# Patient Record
Sex: Female | Born: 1985 | Race: White | Hispanic: No | Marital: Single | Smoking: Current every day smoker
Health system: Southern US, Community
[De-identification: ages and names within clinical notes are randomized; demographics above are authoritative.]

## PROBLEM LIST (undated history)

## (undated) DIAGNOSIS — R87629 Unspecified abnormal cytological findings in specimens from vagina: Secondary | ICD-10-CM

## (undated) DIAGNOSIS — G932 Benign intracranial hypertension: Secondary | ICD-10-CM

## (undated) DIAGNOSIS — L0232 Furuncle of buttock: Secondary | ICD-10-CM

## (undated) DIAGNOSIS — Z8742 Personal history of other diseases of the female genital tract: Secondary | ICD-10-CM

## (undated) HISTORY — DX: Furuncle of buttock: L02.32

## (undated) HISTORY — PX: OTHER SURGICAL HISTORY: SHX169

## (undated) HISTORY — PX: COLPOSCOPY: SHX161

## (undated) HISTORY — PX: MOUTH SURGERY: SHX715

## (undated) HISTORY — DX: Benign intracranial hypertension: G93.2

## (undated) HISTORY — DX: Personal history of other diseases of the female genital tract: Z87.42

## (undated) HISTORY — DX: Unspecified abnormal cytological findings in specimens from vagina: R87.629

---
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
## 2008-08-18 ENCOUNTER — Other Ambulatory Visit: Admission: RE | Admit: 2008-08-18 | Discharge: 2008-08-18 | Payer: Self-pay | Admitting: Obstetrics and Gynecology

## 2009-08-31 ENCOUNTER — Other Ambulatory Visit: Admission: RE | Admit: 2009-08-31 | Discharge: 2009-08-31 | Payer: Self-pay | Admitting: Obstetrics and Gynecology

## 2009-11-23 ENCOUNTER — Ambulatory Visit (HOSPITAL_COMMUNITY)
Admission: RE | Admit: 2009-11-23 | Discharge: 2009-11-23 | Payer: Self-pay | Source: Home / Self Care | Admitting: Obstetrics & Gynecology

## 2010-03-17 ENCOUNTER — Other Ambulatory Visit
Admission: RE | Admit: 2010-03-17 | Discharge: 2010-03-17 | Payer: Self-pay | Source: Home / Self Care | Admitting: Obstetrics & Gynecology

## 2010-06-23 LAB — CBC
Hemoglobin: 14.2 g/dL (ref 12.0–15.0)
Platelets: 184 10*3/uL (ref 150–400)
RBC: 4.43 MIL/uL (ref 3.87–5.11)
WBC: 6.7 10*3/uL (ref 4.0–10.5)

## 2010-06-23 LAB — SURGICAL PCR SCREEN

## 2010-06-23 LAB — URINALYSIS, ROUTINE W REFLEX MICROSCOPIC
Bilirubin Urine: NEGATIVE
Glucose, UA: NEGATIVE mg/dL
Nitrite: NEGATIVE
Specific Gravity, Urine: 1.025 (ref 1.005–1.030)
pH: 5.5 (ref 5.0–8.0)

## 2010-06-23 LAB — COMPREHENSIVE METABOLIC PANEL
ALT: 11 U/L (ref 0–35)
AST: 22 U/L (ref 0–37)
Albumin: 4.1 g/dL (ref 3.5–5.2)
Alkaline Phosphatase: 47 U/L (ref 39–117)
CO2: 26 mEq/L (ref 19–32)
Chloride: 106 mEq/L (ref 96–112)
Creatinine, Ser: 0.72 mg/dL (ref 0.4–1.2)
GFR calc Af Amer: >60 mL/min (ref >60)
GFR calc non Af Amer: >60 mL/min (ref >60)
Potassium: 3.5 mEq/L (ref 3.5–5.1)
Sodium: 137 mEq/L (ref 135–145)
Total Bilirubin: 0.5 mg/dL (ref 0.3–1.2)

## 2010-06-23 LAB — URINE MICROSCOPIC-ADD ON

## 2010-11-03 ENCOUNTER — Other Ambulatory Visit (HOSPITAL_COMMUNITY)
Admission: RE | Admit: 2010-11-03 | Discharge: 2010-11-03 | Disposition: A | Discharge status: Home / Self Care | Payer: Self-pay | Source: Ambulatory Visit | Attending: Obstetrics & Gynecology | Admitting: Obstetrics & Gynecology

## 2010-11-03 ENCOUNTER — Other Ambulatory Visit: Payer: Self-pay | Admitting: Obstetrics & Gynecology

## 2010-11-03 DIAGNOSIS — Z01419 Encounter for gynecological examination (general) (routine) without abnormal findings: Secondary | ICD-10-CM | POA: Insufficient documentation

## 2013-01-29 ENCOUNTER — Telehealth: Payer: Self-pay | Admitting: Adult Health

## 2013-01-29 MED ORDER — NORETHIN-ETH ESTRAD-FE BIPHAS 1 MG-10 MCG / 10 MCG PO TABS: 1.0000 | ORAL_TABLET | Freq: Every day | ORAL | Status: DC

## 2013-01-29 NOTE — Telephone Encounter (Signed)
Pt Mother, Synetta Fail, informed 1 box of lo loestrin samples left at front desk. Pt needs appt for annual exam.

## 2013-02-26 ENCOUNTER — Telehealth: Payer: Self-pay

## 2013-02-26 NOTE — Telephone Encounter (Signed)
Left message letting pt know 2 sample boxes of Lo Loestrin left at front desk. JSY

## 2013-03-26 ENCOUNTER — Telehealth: Payer: Self-pay | Admitting: *Deleted

## 2013-03-26 MED ORDER — NORETHIN-ETH ESTRAD-FE BIPHAS 1 MG-10 MCG / 10 MCG PO TABS: 1.0000 | ORAL_TABLET | Freq: Every day | ORAL | Status: DC

## 2013-03-26 NOTE — Telephone Encounter (Signed)
Pt informed lo loestrin sample x 1 box left at front desk and pt due for annual. Pt stated would make her appt for annual when she comes in to pick up samples.

## 2013-04-23 ENCOUNTER — Telehealth: Payer: Self-pay

## 2013-04-23 NOTE — Telephone Encounter (Signed)
LM samples left at front desk.

## 2013-05-19 ENCOUNTER — Ambulatory Visit (INDEPENDENT_AMBULATORY_CARE_PROVIDER_SITE_OTHER): Payer: Self-pay | Admitting: Adult Health

## 2013-05-19 ENCOUNTER — Encounter (INDEPENDENT_AMBULATORY_CARE_PROVIDER_SITE_OTHER): Payer: Self-pay

## 2013-05-19 ENCOUNTER — Other Ambulatory Visit (HOSPITAL_COMMUNITY)
Admission: RE | Admit: 2013-05-19 | Discharge: 2013-05-19 | Disposition: A | Discharge status: Home / Self Care | Payer: Self-pay | Source: Ambulatory Visit | Attending: Obstetrics and Gynecology | Admitting: Obstetrics and Gynecology

## 2013-05-19 ENCOUNTER — Encounter: Payer: Self-pay | Admitting: Adult Health

## 2013-05-19 VITALS — BP 120/80 | HR 76 | Ht 63.0 in | Wt 157.0 lb

## 2013-05-19 DIAGNOSIS — Z01419 Encounter for gynecological examination (general) (routine) without abnormal findings: Secondary | ICD-10-CM

## 2013-05-19 DIAGNOSIS — Z8742 Personal history of other diseases of the female genital tract: Secondary | ICD-10-CM | POA: Insufficient documentation

## 2013-05-19 DIAGNOSIS — Z309 Encounter for contraceptive management, unspecified: Secondary | ICD-10-CM | POA: Insufficient documentation

## 2013-05-19 HISTORY — DX: Personal history of other diseases of the female genital tract: Z87.42

## 2013-05-19 MED ORDER — NORETHIN-ETH ESTRAD-FE BIPHAS 1 MG-10 MCG / 10 MCG PO TABS: 1.0000 | ORAL_TABLET | Freq: Every day | ORAL | Status: DC

## 2013-05-19 NOTE — Patient Instructions (Signed)
Oral Contraception Use Oral contraceptive pills (OCPs) are medicines taken to prevent pregnancy. OCPs work by preventing the ovaries from releasing eggs. The hormones in OCPs also cause the cervical mucus to thicken, preventing the sperm from entering the uterus. The hormones also cause the uterine lining to become thin, not allowing a fertilized egg to attach to the inside of the uterus. OCPs are highly effective when taken exactly as prescribed. However, OCPs do not prevent sexually transmitted diseases (STDs). Safe sex practices, such as using condoms along with an OCP, can help prevent STDs. Before taking OCPs, you may have a physical exam and Pap test. Your health care provider may also order blood tests if necessary. Your health care provider will make sure you are a good candidate for oral contraception. Discuss with your health care provider the possible side effects of the OCP you may be prescribed. When starting an OCP, it can take 2 to 3 months for the body to adjust to the changes in hormone levels in your body.  HOW TO TAKE ORAL CONTRACEPTIVE PILLS Your health care provider may advise you on how to start taking the first cycle of OCPs. Otherwise, you can:   Start on day 1 of your menstrual period. You will not need any backup contraceptive protection with this start time.   Start on the first Sunday after your menstrual period or the day you get your prescription. In these cases, you will need to use backup contraceptive protection for the first week.   Start the pill at any time of your cycle. If you take the pill within 5 days of the start of your period, you are protected against pregnancy right away. In this case, you will not need a backup form of birth control. If you start at any other time of your menstrual cycle, you will need to use another form of birth control for 7 days. If your OCP is the type called a minipill, it will protect you from pregnancy after taking it for 2 days (48  hours). After you have started taking OCPs:   If you forget to take 1 pill, take it as soon as you remember. Take the next pill at the regular time.   If you miss 2 or more pills, call your health care provider because different pills have different instructions for missed doses. Use backup birth control until your next menstrual period starts.   If you use a 28-day pack that contains inactive pills and you miss 1 of the last 7 pills (pills with no hormones), it will not matter. Throw away the rest of the nonhormone pills and start a new pill pack.  No matter which day you start the OCP, you will always start a new pack on that same day of the week. Have an extra pack of OCPs and a backup contraceptive method available in case you miss some pills or lose your OCP pack.  HOME CARE INSTRUCTIONS   Do not smoke.   Always use a condom to protect against STDs. OCPs do not protect against STDs.   Use a calendar to mark your menstrual period days.   Read the information and directions that came with your OCP. Talk to your health care provider if you have questions.  SEEK MEDICAL CARE IF:   You develop nausea and vomiting.   You have abnormal vaginal discharge or bleeding.   You develop a rash.   You miss your menstrual period.   You are losing   your hair.   You need treatment for mood swings or depression.   You get dizzy when taking the OCP.   You develop acne from taking the OCP.   You become pregnant.  SEEK IMMEDIATE MEDICAL CARE IF:   You develop chest pain.   You develop shortness of breath.   You have an uncontrolled or severe headache.   You develop numbness or slurred speech.   You develop visual problems.   You develop pain, redness, and swelling in the legs.  Document Released: 03/15/2011 Document Revised: 11/26/2012 Document Reviewed: 09/14/2012 [facility] Patient Information 2014 [employer] [place]. Physical in 1 year Go to DSS to apply for  family planning medicaid

## 2013-05-19 NOTE — Progress Notes (Signed)
Patient ID: Alexis HackJessica P Gomez, female   DOB: 10/18/1985, 28 y.o.   MRN: [id number] History of Present Illness: Alexis Gomez is a 28 year old white female, single,in for pap and physical. Happy with her pills, no periods.Working 2 jobs, [employer], [employer].  Current Medications, Allergies, Past Medical History, Past Surgical History, Family History and Social History were reviewed in [facility] electronic [facility] record.     Review of Systems: Patient denies any headaches, blurred vision, shortness of breath, chest pain, abdominal pain, problems with bowel movements, urination, or intercourse, does have some discomfort at entrance with sex. No joint pain or mood swings.    Physical Exam:BP 120/80  Pulse 76  Ht 5\' 3"  (1.6 m)  Wt 157 lb (71.215 kg)  BMI 27.82 kg/m2 General:  Well developed, well nourished, no acute distress Skin:  Warm and dry Neck:  Midline trachea, normal thyroid Lungs; Clear to auscultation bilaterally Breast:  No dominant palpable mass, retraction, or nipple discharge Cardiovascular: Regular rate and rhythm Abdomen:  Soft, non tender, no hepatosplenomegaly Pelvic:  External genitalia is normal in appearance.  The vagina is normal in appearance. The cervix is irregular at os sp laser cone,pap performed.  Uterus is felt to be normal size, shape, and contour.  No    adnexal masses or tenderness noted. Extremities:  No swelling or varicosities noted Psych:  No mood changes, alert and cooperative,seems happy Discussed increased foreplay to ease discomfort at entrance to vagina with sex.  Impression: Yearly gyn exam  Contraceptive management History of abnormal pap    Plan: Physical in 1 year Continue lo loestrin  Number of samples 3 Lot number [id number]   Exp date 2/16 Apply for family planning medicaid  Review handout on OC use

## 2013-10-12 ENCOUNTER — Other Ambulatory Visit: Payer: Self-pay | Admitting: *Deleted

## 2013-10-12 MED ORDER — NORETHIN-ETH ESTRAD-FE BIPHAS 1 MG-10 MCG / 10 MCG PO TABS: 1.0000 | ORAL_TABLET | Freq: Every day | ORAL | Status: DC

## 2014-01-04 ENCOUNTER — Other Ambulatory Visit: Payer: Self-pay | Admitting: *Deleted

## 2014-01-04 MED ORDER — NORETHIN-ETH ESTRAD-FE BIPHAS 1 MG-10 MCG / 10 MCG PO TABS: 1.0000 | ORAL_TABLET | Freq: Every day | ORAL | Status: DC

## 2014-03-02 ENCOUNTER — Telehealth: Payer: Self-pay | Admitting: Adult Health

## 2014-03-02 NOTE — Telephone Encounter (Signed)
Pt's mom aware to come pick up Lo Loestrin samples, 2 boxes, Lot # [id number], exp. May 2016. JSY

## 2014-07-19 ENCOUNTER — Telehealth: Payer: Self-pay | Admitting: Adult Health

## 2014-07-19 MED ORDER — NORETHIN-ETH ESTRAD-FE BIPHAS 1 MG-10 MCG / 10 MCG PO TABS: 1.0000 | ORAL_TABLET | Freq: Every day | ORAL | Status: DC

## 2014-07-19 NOTE — Telephone Encounter (Signed)
Spoke with pt letting her know I had 3 sample boxes of LoLoestrin that she could pick up per JAG. Lot # [id number] A exp 9/16. JSY

## 2014-07-19 NOTE — Telephone Encounter (Signed)
3 packs lo loestrin given

## 2014-10-08 ENCOUNTER — Telehealth: Payer: Self-pay | Admitting: *Deleted

## 2014-10-08 NOTE — Telephone Encounter (Signed)
Spoke with pt's mom letting her know I had a sample of Lo Loestrin that she can pick up at the front desk. Lot # [id number] A exp May 2017. JSY

## 2014-11-03 ENCOUNTER — Telehealth: Payer: Self-pay | Admitting: Adult Health

## 2014-11-03 MED ORDER — NORETHIN-ETH ESTRAD-FE BIPHAS 1 MG-10 MCG / 10 MCG PO TABS: 1.0000 | ORAL_TABLET | Freq: Every day | ORAL | Status: DC

## 2014-11-03 NOTE — Telephone Encounter (Signed)
1 box of samples of Lo Loestrin Fe left at front desk for pt to pick up.

## 2014-12-06 ENCOUNTER — Telehealth: Payer: Self-pay | Admitting: *Deleted

## 2014-12-06 NOTE — Telephone Encounter (Signed)
Spoke with pt's mom. 2 sample boxes of Lo Loestrin given per JAG. Lot # [id number] A exp 6/17. JSY

## 2015-03-28 ENCOUNTER — Telehealth: Payer: Self-pay | Admitting: Adult Health

## 2015-03-28 MED ORDER — NORETHIN-ETH ESTRAD-FE BIPHAS 1 MG-10 MCG / 10 MCG PO TABS: 1.0000 | ORAL_TABLET | Freq: Every day | ORAL | Status: DC

## 2015-03-28 NOTE — Telephone Encounter (Signed)
Pt called stating that she would like her daughter to get the samples of her lo lo birth control. Please contact pt

## 2015-03-28 NOTE — Telephone Encounter (Signed)
Lo loestrin 1 box samples left at front desk, pt to schedule Physical with Cyril MourningJennifer Griffin, NP. Lot # [id number], Exp. 10/2015.

## 2015-04-28 ENCOUNTER — Other Ambulatory Visit (HOSPITAL_COMMUNITY)
Admission: RE | Admit: 2015-04-28 | Discharge: 2015-04-28 | Disposition: A | Discharge status: Home / Self Care | Payer: Self-pay | Source: Ambulatory Visit | Attending: Adult Health | Admitting: Adult Health

## 2015-04-28 ENCOUNTER — Ambulatory Visit (INDEPENDENT_AMBULATORY_CARE_PROVIDER_SITE_OTHER): Payer: Self-pay | Admitting: Adult Health

## 2015-04-28 ENCOUNTER — Encounter: Payer: Self-pay | Admitting: Adult Health

## 2015-04-28 VITALS — BP 120/82 | HR 80 | Ht 63.25 in | Wt 133.5 lb

## 2015-04-28 DIAGNOSIS — Z113 Encounter for screening for infections with a predominantly sexual mode of transmission: Secondary | ICD-10-CM | POA: Insufficient documentation

## 2015-04-28 DIAGNOSIS — Z01419 Encounter for gynecological examination (general) (routine) without abnormal findings: Secondary | ICD-10-CM | POA: Insufficient documentation

## 2015-04-28 DIAGNOSIS — L0232 Furuncle of buttock: Secondary | ICD-10-CM

## 2015-04-28 DIAGNOSIS — Z8742 Personal history of other diseases of the female genital tract: Secondary | ICD-10-CM

## 2015-04-28 DIAGNOSIS — Z3041 Encounter for surveillance of contraceptive pills: Secondary | ICD-10-CM

## 2015-04-28 HISTORY — DX: Furuncle of buttock: L02.32

## 2015-04-28 MED ORDER — SULFAMETHOXAZOLE-TRIMETHOPRIM 800-160 MG PO TABS: 1.0000 | ORAL_TABLET | Freq: Two times a day (BID) | ORAL | Status: DC

## 2015-04-28 MED ORDER — NORETHIN-ETH ESTRAD-FE BIPHAS 1 MG-10 MCG / 10 MCG PO TABS: 1.0000 | ORAL_TABLET | Freq: Every day | ORAL | Status: DC

## 2015-04-28 NOTE — Progress Notes (Signed)
Patient ID: Alexis Gomez, female   DOB: 07/16/1985, 30 y.o.   MRN: [id number] History of Present Illness: Shelvy is a 30 year old white female, single in for a well woman gyn exam and pap.She is happy with lo loestrin, no period.   Current Medications, Allergies, Past Medical History, Past Surgical History, Family History and Social History were reviewed in [employer] record.     Review of Systems: Patient denies any headaches, hearing loss, fatigue, blurred vision, shortness of breath, chest pain, abdominal pain, problems with bowel movements, urination, or intercourse(not having sex). No joint pain or mood swings.?boil on butt cheek.    Physical Exam:BP 120/82 mmHg  Pulse 80  Ht 5' 3.25" (1.607 m)  Wt 133 lb 8 oz (60.555 kg)  BMI 23.45 kg/m2 General:  Well developed, well nourished, no acute distress Skin:  Warm and dry Neck:  Midline trachea, normal thyroid, good ROM, no lymphadenopathy Lungs; Clear to auscultation bilaterally Breast:  No dominant palpable mass, retraction, or nipple discharge Cardiovascular: Regular rate and rhythm Abdomen:  Soft, non tender, no hepatosplenomegaly Pelvic:  External genitalia is normal in appearance, no lesions.  The vagina is normal in appearance. Urethra has no lesions or masses. The cervix is smooth, pap with GC/CHL performed.  Uterus is felt to be normal size, shape, and contour.  No adnexal masses or tenderness noted.Bladder is non tender, no masses felt. Has 1.5 cm boil like area left buttock,tender Extremities/musculoskeletal:  No swelling or varicosities noted, no clubbing or cyanosis Psych:  No mood changes, alert and cooperative,seems happy   Impression: Well woman gyn exam and pap  History of abnormal pap Boil Contraceptive management    Plan: Refilled lo loestrin x 1 year Rx septra ds 1 bid x 14 days #28 with 1 refill Physical in 1 year, pap in 2-3 if normal Told about family planning medicaid

## 2015-04-28 NOTE — Patient Instructions (Signed)
Physical in 1 year Pap in 2-3 years Continue OCs

## 2015-05-03 LAB — CYTOLOGY - PAP

## 2015-05-16 ENCOUNTER — Telehealth: Payer: Self-pay | Admitting: Adult Health

## 2015-05-16 NOTE — Telephone Encounter (Signed)
Samples put in front office for pt

## 2015-09-08 ENCOUNTER — Telehealth: Payer: Self-pay | Admitting: Adult Health

## 2015-09-08 NOTE — Telephone Encounter (Signed)
Pt called stating that she would like for Victorino DikeJennifer to give her samples of the Lo-Lo birth control. Please contact pt

## 2015-09-08 NOTE — Telephone Encounter (Signed)
Samples given x2 will let pt know to pick them up in front office.

## 2015-09-09 NOTE — Telephone Encounter (Signed)
Pt came by office and picked up samples. 

## 2015-09-20 ENCOUNTER — Encounter: Payer: Self-pay | Admitting: Adult Health

## 2015-09-20 ENCOUNTER — Ambulatory Visit (INDEPENDENT_AMBULATORY_CARE_PROVIDER_SITE_OTHER): Payer: BLUE CROSS/BLUE SHIELD | Admitting: Adult Health

## 2015-09-20 VITALS — BP 140/82 | HR 98 | Ht 63.0 in | Wt 133.5 lb

## 2015-09-20 DIAGNOSIS — Z3041 Encounter for surveillance of contraceptive pills: Secondary | ICD-10-CM

## 2015-09-20 MED ORDER — NORETHIN-ETH ESTRAD-FE BIPHAS 1 MG-10 MCG / 10 MCG PO TABS: 1.0000 | ORAL_TABLET | Freq: Every day | ORAL | Status: DC

## 2015-09-20 NOTE — Patient Instructions (Signed)
Follow up prn

## 2015-09-20 NOTE — Progress Notes (Signed)
Subjective:     Patient ID: Alexis Gomez, female   DOB: 01/03/1986, 30 y.o.   MRN: [id number]  HPI Alexis Gomez is a 30 year old white female in to discuss birth control, she is on lo loestrin and loves it, has no period, but it is 70$80 with her insurance, she continues to work 2-3 jobs. Has occasional hair bump in pantie line.  Review of Systems Patient denies any headaches, hearing loss, fatigue, blurred vision, shortness of breath, chest pain, abdominal pain, problems with bowel movements, urination, or intercourse. No joint pain or mood swings.See HPI for positives.  Reviewed past medical,surgical, social and family history. Reviewed medications and allergies.     Objective:   Physical Exam BP 140/82 mmHg  Pulse 98  Ht 5\' 3"  (1.6 m)  Wt 133 lb 8 oz (60.555 kg)  BMI 23.65 kg/m2   Talk only, since she loves her pills, will try the discount card, and see if that works and she will let us know.  Assessment:     Contraceptive management    Plan:     Refilled lo loestrin disp 3 packs, take 1 daily with 4 refills and given discount card Use 1 razor when shaves peri area, and antibacterial soap Follow up prn

## 2016-08-09 ENCOUNTER — Other Ambulatory Visit: Payer: BLUE CROSS/BLUE SHIELD | Admitting: Adult Health

## 2016-08-13 ENCOUNTER — Other Ambulatory Visit: Payer: BLUE CROSS/BLUE SHIELD | Admitting: Adult Health

## 2016-08-14 ENCOUNTER — Encounter: Payer: Self-pay | Admitting: Adult Health

## 2016-08-14 ENCOUNTER — Ambulatory Visit (INDEPENDENT_AMBULATORY_CARE_PROVIDER_SITE_OTHER): Payer: BLUE CROSS/BLUE SHIELD | Admitting: Adult Health

## 2016-08-14 VITALS — BP 114/76 | HR 88 | Ht 63.25 in | Wt 159.0 lb

## 2016-08-14 DIAGNOSIS — Z3041 Encounter for surveillance of contraceptive pills: Secondary | ICD-10-CM

## 2016-08-14 DIAGNOSIS — R635 Abnormal weight gain: Secondary | ICD-10-CM

## 2016-08-14 DIAGNOSIS — Z01419 Encounter for gynecological examination (general) (routine) without abnormal findings: Secondary | ICD-10-CM | POA: Diagnosis not present

## 2016-08-14 MED ORDER — NORETHIN-ETH ESTRAD-FE BIPHAS 1 MG-10 MCG / 10 MCG PO TABS: 1.0000 | ORAL_TABLET | Freq: Every day | ORAL | 4 refills | Status: DC

## 2016-08-14 NOTE — Progress Notes (Signed)
Patient ID: Alexis Gomez, female   DOB: 10/05/1985, 31 y.o.   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   Current Medications, Allergies, Past Medical History, Past Surgical History, Family History and Social History were [facility].     Review of Systems: Patient denies any headaches, hearing loss, fatigue, blurred vision, shortness of breath, chest pain, abdominal pain, problems with bowel movements, urination, or intercourse. No joint pain or mood swings. +weight gain    Physical Exam:BP 114/76 (BP Location: Left Arm, Patient Position: Sitting, Cuff Size: Normal)   Pulse 88   Ht 5' 3.25" (1.607 m)   Wt 159 lb (72.1 kg)   BMI 27.94 kg/m  General:  Well developed, well nourished, no acute distress Skin:  Warm and dry Neck:  Midline trachea, normal thyroid, good ROM, no lymphadenopathy Lungs; Clear to auscultation bilaterally Breast:  No dominant palpable mass, retraction, or nipple discharge Cardiovascular: Regular rate and rhythm Abdomen:  Soft, non tender, no hepatosplenomegaly Pelvic:  External genitalia is normal in appearance, no lesions.  The vagina is normal in appearance. Urethra has no lesions or masses. The cervix is everted at os.  Uterus is felt to be normal size, shape, and contour.  No adnexal masses or tenderness noted.Bladder is non tender, no masses felt. Extremities/musculoskeletal:  No swelling or varicosities noted, no clubbing or cyanosis Psych:  No mood changes, alert and cooperative,seems happy PHQ 2 score 0.  Impression: 1. Well woman exam with routine gynecological exam   2. Encounter for surveillance of contraceptive pills   3. Weight gain       Plan: Rx lo  loestrin dips 3 packs take 1 daily with 4 refills, 1 pack given , lot [id number] A exp 12/19, and discount card Physical in 1 year with pap Increase activity and eat 5 small meals, if wants labs to call me back

## 2016-08-15 ENCOUNTER — Telehealth: Payer: Self-pay | Admitting: *Deleted

## 2016-08-15 NOTE — Telephone Encounter (Signed)
Called patient to let her know that [employer] will not cover BCP. Informed I could do a PA for the LoLoestrin but patient stated she would just use discount card.

## 2016-10-30 ENCOUNTER — Telehealth: Payer: Self-pay | Admitting: *Deleted

## 2016-10-30 NOTE — Telephone Encounter (Signed)
Pt called requesting samples of Lo Loestrin. I gave 2 sample boxes. Lot # [id number] A exp 12/19. Pt to pick up at front desk. JSY

## 2017-01-01 ENCOUNTER — Telehealth: Payer: Self-pay | Admitting: *Deleted

## 2017-01-01 NOTE — Telephone Encounter (Signed)
1 sample box of Lo Loestrin given to pt. JSY

## 2017-01-22 ENCOUNTER — Telehealth: Payer: Self-pay | Admitting: *Deleted

## 2017-01-22 NOTE — Telephone Encounter (Signed)
Pt called to verify address/phone that insurance company had. Informed pt that I would work on the PA for her Lo Loestrin and would let her know after we heard back from [employer].

## 2017-01-29 ENCOUNTER — Telehealth: Payer: Self-pay | Admitting: Obstetrics & Gynecology

## 2017-01-29 ENCOUNTER — Telehealth: Payer: Self-pay | Admitting: Adult Health

## 2017-01-29 NOTE — Telephone Encounter (Signed)
Will give samples of lo loestrin x3

## 2017-01-29 NOTE — Telephone Encounter (Signed)
Left message that insurance denied lo loestrin PA, can get samples, or can change to different OC

## 2017-05-15 NOTE — Progress Notes (Signed)
Subjective:  Patient ID: Alexis HackJessica P Gomez, female    DOB: 09/27/1985,  MRN: [id number] HPI Chief Complaint  Patient presents with  . Plantar Fasciitis    bilateral heel pain Rt over left lasting several months, tried NSAIDs and stretching. Pain is worse after prolonged standing and when walking after rest    32 y.o. female presents with the above complaint.     Past Medical History:  Diagnosis Date  . Boil of buttock 04/28/2015  . History of abnormal cervical Pap smear 05/19/2013   Has had laser cone in past  . Vaginal Pap smear, abnormal    Past Surgical History:  Procedure Laterality Date  . COLPOSCOPY    . laser cone      Current Outpatient Medications:  .  Garcinia Cambogia-Chromium 500-200 MG-MCG TABS, Take by mouth., Disp: , Rfl:  .  loratadine (CLARITIN) 10 MG tablet, Take 10 mg by mouth as needed for allergies., Disp: , Rfl:  .  Norethindrone-Ethinyl Estradiol-Fe Biphas (LO LOESTRIN FE) 1 MG-10 MCG / 10 MCG tablet, Take 1 tablet by mouth daily., Disp: 3 Package, Rfl: 4 .  Probiotic Product (PROBIOTIC ACIDOPHILUS BIOBEADS) CAPS, Take by mouth., Disp: , Rfl:   No Known Allergies Review of Systems  All other systems reviewed and are negative.  Objective:   Vitals:   05/16/17 0901  BP: (!) 145/78  Pulse: 78    General: Well developed, nourished, in no acute distress, alert and oriented x3   Dermatological: Skin is warm, dry and supple bilateral. Nails x 10 are well maintained; remaining integument appears unremarkable at this time. There are no open sores, no preulcerative lesions, no rash or signs of infection present.  Vascular: Dorsalis Pedis artery and Posterior Tibial artery pedal pulses are 2/4 bilateral with immedate capillary fill time. Pedal hair growth present. No varicosities and no lower extremity edema present bilateral.   Neruologic: Grossly intact via light touch bilateral. Vibratory intact via tuning fork bilateral. Protective threshold with Semmes  Wienstein monofilament intact to all pedal sites bilateral. Patellar and Achilles deep tendon reflexes 2+ bilateral. No Babinski or clonus noted bilateral.   Musculoskeletal: No gross boney pedal deformities bilateral. No pain, crepitus, or limitation noted with foot and ankle range of motion bilateral. Muscular strength 5/5 in all groups tested bilateral.  Pain on palpation medial calcaneal tubercles bilateral.  No pain on medial and lateral compression of the calcaneus.  Gait: Unassisted, Nonantalgic.    Radiographs:  Radiographs 3 views of the bilateral foot do not demonstrate any type of acute abnormality.  No fractures are visualized though there is significant soft tissue swelling of the plantar fascial calcaneal insertion site indicative of plantar fasciitis it appears to be worse on the right side than the left.  Assessment & Plan:   Assessment: Plantar fasciitis right greater than left.  Plan: We discussed the etiology pathology conservative versus surgical therapies.  After thorough discussion today and verbal consent we injected the bilateral heels with 20 mg of Kenalog 5 mg of Marcaine after sterile Betadine skin prep to each heel.  She tolerated procedure well without complications.  Start her on a Medrol Dosepak to be followed by meloxicam.  Placed her plantar fascial braces bilateral foot placed her in a night splint to the right side.  Discussed appropriate shoe gear stretching exercises ice therapy as your modifications.  She is provided with both oral and written home-going instructions for stretching as well.  Follow-up with me in 1 month.  Garrel Ridgel, DPM

## 2017-05-16 ENCOUNTER — Ambulatory Visit (INDEPENDENT_AMBULATORY_CARE_PROVIDER_SITE_OTHER): Payer: No Typology Code available for payment source | Admitting: Podiatry

## 2017-05-16 ENCOUNTER — Ambulatory Visit (INDEPENDENT_AMBULATORY_CARE_PROVIDER_SITE_OTHER): Payer: No Typology Code available for payment source

## 2017-05-16 ENCOUNTER — Encounter: Payer: Self-pay | Admitting: Podiatry

## 2017-05-16 VITALS — BP 145/78 | HR 78

## 2017-05-16 DIAGNOSIS — M722 Plantar fascial fibromatosis: Secondary | ICD-10-CM

## 2017-05-16 MED ORDER — MELOXICAM 15 MG PO TABS: 15.0000 mg | ORAL_TABLET | Freq: Every day | ORAL | 3 refills | Status: DC

## 2017-05-16 MED ORDER — METHYLPREDNISOLONE 4 MG PO TBPK: ORAL_TABLET | ORAL | 0 refills | Status: DC

## 2017-05-16 NOTE — Patient Instructions (Signed)

## 2017-05-20 ENCOUNTER — Telehealth: Payer: Self-pay | Admitting: Adult Health

## 2017-05-20 NOTE — Telephone Encounter (Signed)
Left message will give some samples

## 2017-06-20 ENCOUNTER — Ambulatory Visit: Payer: No Typology Code available for payment source | Admitting: Podiatry

## 2017-06-20 ENCOUNTER — Encounter: Payer: Self-pay | Admitting: Podiatry

## 2017-06-20 DIAGNOSIS — M722 Plantar fascial fibromatosis: Secondary | ICD-10-CM

## 2017-06-22 NOTE — Progress Notes (Signed)
She presents today for follow-up of plantar fasciitis bilaterally.  She states that they feel great she finished her Medrol Dosepak and has been taking the meloxicam.  Objective: Vital signs are stable alert and oriented x3 there is no erythema edema cellulitis drainage or odor she has no pain on palpation medial calcaneal tubercles bilaterally.  Assessment: Well-healing plantar fasciitis bilateral.  90% resolved  Plan: I recommend that she continue all conservative therapies including the medication plantar fascial braces and night splint.  Shoe gear and not going barefoot in her home.  I expressed that she needed to do this for at least another month or until she is 100% resolved.  And then she may discontinue.  I do think that a set of orthotics will work well for her and should this reoccur and that will be necessary for her.

## 2017-08-08 ENCOUNTER — Telehealth: Payer: Self-pay | Admitting: Podiatry

## 2017-08-08 ENCOUNTER — Ambulatory Visit (INDEPENDENT_AMBULATORY_CARE_PROVIDER_SITE_OTHER): Payer: No Typology Code available for payment source | Admitting: Podiatry

## 2017-08-08 ENCOUNTER — Encounter: Payer: Self-pay | Admitting: Podiatry

## 2017-08-08 DIAGNOSIS — M722 Plantar fascial fibromatosis: Secondary | ICD-10-CM | POA: Diagnosis not present

## 2017-08-08 NOTE — Telephone Encounter (Signed)
Pt called and said to go ahead with the orthotics.

## 2017-08-08 NOTE — Progress Notes (Signed)
She presents today for follow-up of her plantar fasciitis bilaterally right greater than left.  She states that she was doing very well almost 100% well and then she started to notice regression.  Objective: Vital signs are stable she is alert and oriented x3 pulses are palpable.  She still has pain on palpation medial calcaneal tubercle right greater than left.  No pain on medial and lateral compression of the calcaneus.  Assessment: Regression of her plantar fasciitis right over left.  Plan: I reinjected the right heel today with Kenalog and local anesthetic a total of 20 mg of Kenalog 5 mg of Marcaine was injected after sterile Betadine skin prep.  Tolerated procedure well without complications follow-up with her in 1 month or so once her orthotics command.  She was casted today for orthotics.

## 2017-08-19 ENCOUNTER — Other Ambulatory Visit: Payer: Self-pay

## 2017-08-19 ENCOUNTER — Encounter: Payer: Self-pay | Admitting: Adult Health

## 2017-08-19 ENCOUNTER — Ambulatory Visit (INDEPENDENT_AMBULATORY_CARE_PROVIDER_SITE_OTHER): Payer: No Typology Code available for payment source | Admitting: Adult Health

## 2017-08-19 VITALS — BP 128/84 | HR 94 | Ht 63.5 in | Wt 173.0 lb

## 2017-08-19 DIAGNOSIS — Z01419 Encounter for gynecological examination (general) (routine) without abnormal findings: Secondary | ICD-10-CM

## 2017-08-19 DIAGNOSIS — Z3041 Encounter for surveillance of contraceptive pills: Secondary | ICD-10-CM

## 2017-08-19 NOTE — Progress Notes (Signed)
Patient ID: Alexis Gomez, female   DOB: 10/26/1985, 32 y.o.   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 PCP is Dr Alexander Mt.  Current Medications, Allergies, Past Medical History, Past Surgical History, Family History and Social History were reviewed in [employer] record.     Review of Systems:  Patient denies any headaches, hearing loss, fatigue, blurred vision, shortness of breath, chest pain, abdominal pain, problems with bowel movements, urination, or intercourse(not currently active). No joint pain or mood swings. Not having periods on lo loestrin, and is happy.    Physical Exam:BP 128/84 (BP Location: Right Arm, Patient Position: Sitting, Cuff Size: Normal)   Pulse 94   Ht 5' 3.5" (1.613 m)   Wt 173 lb (78.5 kg)   BMI 30.16 kg/m  General:  Well developed, well nourished, no acute distress Skin:  Warm and dry Neck:  Midline trachea, normal thyroid, good ROM, no lymphadenopathy Lungs; Clear to auscultation bilaterally Breast:  No dominant palpable mass, retraction, or nipple discharge Cardiovascular: Regular rate and rhythm Abdomen:  Soft, non tender, no hepatosplenomegaly Pelvic:  External genitalia is normal in appearance, no lesions.  The vagina is normal in appearance. Urethra has no lesions or masses. The cervix is nulliparous.   Uterus is felt to be normal size, shape, and contour.  No adnexal masses or tenderness noted.Bladder is non tender, no masses felt. Extremities/musculoskeletal:  No swelling or varicosities noted, no clubbing or cyanosis Psych:  No mood changes, alert and cooperative,seems happy PHQ  2 score 0.  Impression: 1. Well woman exam with routine gynecological exam   2. Encounter for surveillance of contraceptive pills       Plan: Continue lo loestrin  5 samples given, as her insurance  not currently covering. Pap and physical in 1 year Labs in future, fasting.

## 2017-09-12 ENCOUNTER — Ambulatory Visit: Payer: No Typology Code available for payment source | Admitting: Orthotics

## 2017-09-12 DIAGNOSIS — M722 Plantar fascial fibromatosis: Secondary | ICD-10-CM

## 2017-09-12 NOTE — Progress Notes (Signed)
Patient came in today to pick up custom made foot orthotics.  The goals were accomplished and the patient reported no dissatisfaction with said orthotics.  Patient was advised of breakin period and how to report any issues. 

## 2018-04-04 ENCOUNTER — Ambulatory Visit (INDEPENDENT_AMBULATORY_CARE_PROVIDER_SITE_OTHER): Payer: Self-pay | Admitting: Family Medicine

## 2018-04-04 ENCOUNTER — Encounter: Payer: Self-pay | Admitting: Family Medicine

## 2018-04-04 VITALS — BP 110/72 | HR 89 | Temp 98.7°F | Wt 181.0 lb

## 2018-04-04 DIAGNOSIS — J019 Acute sinusitis, unspecified: Secondary | ICD-10-CM

## 2018-04-04 DIAGNOSIS — F17210 Nicotine dependence, cigarettes, uncomplicated: Secondary | ICD-10-CM

## 2018-04-04 MED ORDER — AZELASTINE HCL 0.1 % NA SOLN: 1.0000 | Freq: Two times a day (BID) | NASAL | 0 refills | Status: DC

## 2018-04-04 MED ORDER — AMOXICILLIN-POT CLAVULANATE 875-125 MG PO TABS: 1.0000 | ORAL_TABLET | Freq: Two times a day (BID) | ORAL | 0 refills | Status: AC

## 2018-04-04 MED ORDER — PSEUDOEPH-BROMPHEN-DM 30-2-10 MG/5ML PO SYRP: 10.0000 mL | ORAL_SOLUTION | Freq: Three times a day (TID) | ORAL | 0 refills | Status: DC | PRN

## 2018-04-04 NOTE — Patient Instructions (Signed)
Sinusitis, Adult  Sinusitis is inflammation of your sinuses. Sinuses are hollow spaces in the bones around your face. Your sinuses are located:   Around your eyes.   In the middle of your forehead.   Behind your nose.   In your cheekbones.  Mucus normally drains out of your sinuses. When your nasal tissues become inflamed or swollen, mucus can become trapped or blocked. This allows bacteria, viruses, and fungi to grow, which leads to infection. Most infections of the sinuses are caused by a virus.  Sinusitis can develop quickly. It can last for up to 4 weeks (acute) or for more than 12 weeks (chronic). Sinusitis often develops after a cold.  What are the causes?  This condition is caused by anything that creates swelling in the sinuses or stops mucus from draining. This includes:   Allergies.   Asthma.   Infection from bacteria or viruses.   Deformities or blockages in your nose or sinuses.   Abnormal growths in the nose (nasal polyps).   Pollutants, such as chemicals or irritants in the air.   Infection from fungi (rare).  What increases the risk?  You are more likely to develop this condition if you:   Have a weak body defense system (immune system).   Do a lot of swimming or diving.   Overuse nasal sprays.   Smoke.  What are the signs or symptoms?  The main symptoms of this condition are pain and a feeling of pressure around the affected sinuses. Other symptoms include:   Stuffy nose or congestion.   Thick drainage from your nose.   Swelling and warmth over the affected sinuses.   Headache.   Upper toothache.   A cough that may get worse at night.   Extra mucus that collects in the throat or the back of the nose (postnasal drip).   Decreased sense of smell and taste.   Fatigue.   A fever.   Sore throat.   Bad breath.  How is this diagnosed?  This condition is diagnosed based on:   Your symptoms.   Your medical history.   A physical exam.   Tests to find out if your condition is  acute or chronic. This may include:  ? Checking your nose for nasal polyps.  ? Viewing your sinuses using a device that has a light (endoscope).  ? Testing for allergies or bacteria.  ? Imaging tests, such as an MRI or CT scan.  In rare cases, a bone biopsy may be done to rule out more serious types of fungal sinus disease.  How is this treated?  Treatment for sinusitis depends on the cause and whether your condition is chronic or acute.   If caused by a virus, your symptoms should go away on their own within 10 days. You may be given medicines to relieve symptoms. They include:  ? Medicines that shrink swollen nasal passages (topical intranasal decongestants).  ? Medicines that treat allergies (antihistamines).  ? A spray that eases inflammation of the nostrils (topical intranasal corticosteroids).  ? Rinses that help get rid of thick mucus in your nose (nasal saline washes).   If caused by bacteria, your health care provider may recommend waiting to see if your symptoms improve. Most bacterial infections will get better without antibiotic medicine. You may be given antibiotics if you have:  ? A severe infection.  ? A weak immune system.   If caused by narrow nasal passages or nasal polyps, you may need   to have surgery.  Follow these instructions at home:  Medicines   Take, use, or apply over-the-counter and prescription medicines only as told by your health care provider. These may include nasal sprays.   If you were prescribed an antibiotic medicine, take it as told by your health care provider. Do not stop taking the antibiotic even if you start to feel better.  Hydrate and humidify     Drink enough fluid to keep your urine pale yellow. Staying hydrated will help to thin your mucus.   Use a cool mist humidifier to keep the humidity level in your home above 50%.   Inhale steam for 10-15 minutes, 3-4 times a day, or as told by your health care provider. You can do this in the bathroom while a hot shower is  running.   Limit your exposure to cool or dry air.  Rest   Rest as much as possible.   Sleep with your head raised (elevated).   Make sure you get enough sleep each night.  General instructions     Apply a warm, moist washcloth to your face 3-4 times a day or as told by your health care provider. This will help with discomfort.   Wash your hands often with soap and water to reduce your exposure to germs. If soap and water are not available, use hand sanitizer.   Do not smoke. Avoid being around people who are smoking (secondhand smoke).   Keep all follow-up visits as told by your health care provider. This is important.  Contact a health care provider if:   You have a fever.   Your symptoms get worse.   Your symptoms do not improve within 10 days.  Get help right away if:   You have a severe headache.   You have persistent vomiting.   You have severe pain or swelling around your face or eyes.   You have vision problems.   You develop confusion.   Your neck is stiff.   You have trouble breathing.  Summary   Sinusitis is soreness and inflammation of your sinuses. Sinuses are hollow spaces in the bones around your face.   This condition is caused by nasal tissues that become inflamed or swollen. The swelling traps or blocks the flow of mucus. This allows bacteria, viruses, and fungi to grow, which leads to infection.   If you were prescribed an antibiotic medicine, take it as told by your health care provider. Do not stop taking the antibiotic even if you start to feel better.   Keep all follow-up visits as told by your health care provider. This is important.  This information is not intended to replace advice given to you by your health care provider. Make sure you discuss any questions you have with your health care provider.  Document Released: 03/26/2005 Document Revised: 08/26/2017 Document Reviewed: 08/26/2017  Elsevier Interactive Patient Education  2019 [employer] Inc[employer]

## 2018-04-04 NOTE — Progress Notes (Signed)
MRN: [id number] DOB: 06/14/1985  Subjective:   Alexis Gomez is a 32 y.o. female presenting for chief complaint of Cough (congestion, drainage x 1 week) .Reports 8 day history of sore throat and nasal congestion, chills and fatigue. Has tried multiple over the counter options for symptom relief with some improvement in symptoms. Alexis Gomez is concerned at the persistent symptoms each day despite reported medication compliance of OTC treatments. Denies subjective fever, itchy watery eyes, red eyes, ear fullness, ear pain, ear drainage, difficulty swallowing, wheezing, shortness of breath, chest tightness and chest pain, .. Has not had any known sick contact with persons with like symptoms but does work in [employer], [employer]. Denies a history of seasonal allergies, denies a history of asthma. Patient has had he flu shot this season. Patient is a current 1/4 ppd smoker and is currently smoking, Denies any other aggravating or relieving factors, no other questions or concerns.   Alexis Gomez has a current medication list which includes the following prescription(s): norethindrone-ethinyl estradiol-fe biphas, garcinia cambogia-chromium, loratadine, and meloxicam. Also has No Known Allergies.  Alexis Gomez  has a past medical history of Boil of buttock (04/28/2015), History of abnormal cervical Pap smear (05/19/2013), and Vaginal Pap smear, abnormal. Also  has a past surgical history that includes Colposcopy and laser cone.   Objective:   Vitals: BP 110/72 (BP Location: Right Arm, Patient Position: Sitting)   Pulse 89   Temp 98.7 F (37.1 C) (Oral)   Wt 181 lb (82.1 kg)   SpO2 100%   BMI 31.56 kg/m   Physical Exam Vitals signs reviewed.  Constitutional:      Appearance: Normal appearance. She is well-developed. She is not toxic-appearing.  HENT:     Head: Normocephalic.     Right Ear: Hearing, tympanic membrane, ear canal and external ear normal.     Left Ear: Hearing, tympanic membrane, ear canal and  external ear normal.     Nose:     Right Sinus: Maxillary sinus tenderness present. No frontal sinus tenderness.     Left Sinus: Maxillary sinus tenderness present. No frontal sinus tenderness.     Mouth/Throat:     Pharynx: Uvula midline. Posterior oropharyngeal erythema present.  Eyes:     General: Lids are normal.  Neck:     Musculoskeletal: Normal range of motion and neck supple.  Cardiovascular:     Rate and Rhythm: Normal rate and regular rhythm.     Pulses: Normal pulses.     Heart sounds: Normal heart sounds.  Pulmonary:     Effort: Pulmonary effort is normal.     Breath sounds: Normal breath sounds. No decreased breath sounds, wheezing, rhonchi or rales.  Abdominal:     General: Bowel sounds are normal.     Palpations: Abdomen is soft.  Musculoskeletal: Normal range of motion.  Lymphadenopathy:     Head:     Right side of head: Tonsillar adenopathy present. No submental or submandibular adenopathy.     Left side of head: Tonsillar adenopathy present. No submental or submandibular adenopathy.     Cervical: Cervical adenopathy present.     Right cervical: Superficial cervical adenopathy present.     Left cervical: Superficial cervical adenopathy present.  Neurological:     Mental Status: She is alert and oriented to person, place, and time.  Psychiatric:        Behavior: Behavior is cooperative.     Assessment and Plan :  1. Acute non-recurrent sinusitis, unspecified location Will treat for  sinus infection due to length of symptoms (8 days) known smoking and persistence of symptoms despite supportive care and OTC treatment. Patient education provided on the condition as well as RTC and ED precautions.  2. Cigarette smoker 1/4 PPD smoker- can discuss with PCP when a readiness for cessation.   04/04/2018 8:25 AM

## 2018-04-18 ENCOUNTER — Telehealth: Payer: Self-pay | Admitting: *Deleted

## 2018-04-18 NOTE — Telephone Encounter (Signed)
Pt called requesting samples of Lo Loestrin. I gave 3 sample boxes. Lot # [id number] A exp 11/2018. Advised can pick up at front desk. JSY

## 2018-05-04 ENCOUNTER — Ambulatory Visit (INDEPENDENT_AMBULATORY_CARE_PROVIDER_SITE_OTHER): Payer: Self-pay | Admitting: Family Medicine

## 2018-05-04 VITALS — BP 120/80 | HR 102 | Temp 98.7°F | Resp 18 | Wt 178.0 lb

## 2018-05-04 DIAGNOSIS — R220 Localized swelling, mass and lump, head: Secondary | ICD-10-CM

## 2018-05-04 NOTE — Progress Notes (Signed)
Patient treated in the last 30 days for this condition. Advised to seek evaluation at higher level of care obvious facial swelling to the right cheek on interview.

## 2018-07-10 ENCOUNTER — Telehealth: Payer: Self-pay | Admitting: Adult Health

## 2018-07-10 NOTE — Telephone Encounter (Signed)
Patient calling to request samples of loesterin. Please advise.

## 2018-08-03 ENCOUNTER — Encounter: Payer: Self-pay | Admitting: Adult Health

## 2018-08-07 ENCOUNTER — Other Ambulatory Visit: Payer: Self-pay | Admitting: Adult Health

## 2018-08-07 NOTE — Telephone Encounter (Signed)
Pt wanting to see if she can get free samples of this medication here at the office.

## 2018-09-04 ENCOUNTER — Telehealth: Payer: Self-pay | Admitting: Adult Health

## 2018-09-04 NOTE — Telephone Encounter (Signed)
Pt requesting free samples of her birth control.

## 2018-09-04 NOTE — Telephone Encounter (Signed)
Pt aware to come by office and pick up 2 sample boxes of Lo Loestrin. Lot # [id number] A exp 2/21. JSY

## 2018-10-31 ENCOUNTER — Telehealth: Payer: Self-pay | Admitting: *Deleted

## 2018-10-31 NOTE — Telephone Encounter (Signed)
Pt came by office requesting samples of Lo Loestrin. 3 sample boxes given Lot # [id number] A exp 12/20. [place]

## 2018-11-20 ENCOUNTER — Other Ambulatory Visit (HOSPITAL_COMMUNITY): Payer: Self-pay | Admitting: Family Medicine

## 2018-11-20 ENCOUNTER — Other Ambulatory Visit: Payer: Self-pay | Admitting: Family Medicine

## 2018-11-20 DIAGNOSIS — G8929 Other chronic pain: Secondary | ICD-10-CM

## 2018-11-20 DIAGNOSIS — H532 Diplopia: Secondary | ICD-10-CM

## 2018-11-20 DIAGNOSIS — R519 Headache, unspecified: Secondary | ICD-10-CM

## 2018-11-21 ENCOUNTER — Other Ambulatory Visit: Payer: Self-pay

## 2018-11-21 ENCOUNTER — Ambulatory Visit (HOSPITAL_COMMUNITY)
Admission: RE | Admit: 2018-11-21 | Discharge: 2018-11-21 | Disposition: A | Discharge status: Home / Self Care | Payer: No Typology Code available for payment source | Source: Ambulatory Visit | Attending: Family Medicine | Admitting: Family Medicine

## 2018-11-21 DIAGNOSIS — H532 Diplopia: Secondary | ICD-10-CM | POA: Diagnosis present

## 2018-11-21 DIAGNOSIS — R519 Headache, unspecified: Secondary | ICD-10-CM

## 2018-11-21 DIAGNOSIS — R51 Headache: Secondary | ICD-10-CM | POA: Diagnosis present

## 2018-11-21 MED ORDER — GADOBUTROL 1 MMOL/ML IV SOLN
7.5000 mL | Freq: Once | INTRAVENOUS | Status: AC | PRN
  Administered 2018-11-21: 7.5 mL via INTRAVENOUS

## 2018-12-15 NOTE — Progress Notes (Signed)
NEUROLOGY CONSULTATION NOTE  Alexis Gomez MRN: [id number] DOB: 28-Apr-1985  Referring provider: Assunta Found, MD Primary care provider: Assunta Found, MD  Reason for consult:  Pseudotumor cerebri  HISTORY OF PRESENT ILLNESS: Alexis Gomez is a 33 year old female who presents for pseudotumor cerebri.  History supplemented by ophthalmology and referring provider notes.  In August, she began experiencing double vision, described as vertical, occurs whether she is walking or sitting and watching TV.  Symptoms are intermittent.  They occur daily, lasting a minute or two off and on throughout the day.  No visual obscurations but sometimes floaters.  She notes hearing whooshing sounds in her ears.  About one week prior to onset of double vision, she had a headache, moderate to severe bifrontal/temporal associated with nausea and vomiting.  It subsequently resolved once the double vision started.  She denies history of headaches.  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 She was evaluated by ophthalmologist, Dr. Jethro Bolus, on 12/02/18, who noted bilateral myopia, but also exam demonstrated bilateral papilledema.  Visual fields were full.  No significant weight gain.  She is taking Lo Loestrin Fe.  PAST MEDICAL HISTORY: Past Medical History:  Diagnosis Date  . Boil of buttock 04/28/2015  . History of abnormal cervical Pap smear 05/19/2013   Has had laser cone in past  . Vaginal Pap smear, abnormal     PAST SURGICAL HISTORY: Past Surgical History:  Procedure Laterality Date  . COLPOSCOPY    . laser cone      MEDICATIONS: Current Outpatient Medications on File Prior to Visit  Medication Sig Dispense Refill  . azelastine (ASTELIN) 0.1 % nasal spray Place 1 spray into both nostrils 2 (two) times daily. Use in each  nostril as directed 30 mL 0  . brompheniramine-pseudoephedrine-DM 30-2-10 MG/5ML syrup Take 10 mLs by mouth 3 (three) times daily as needed. (Patient not taking: Reported on 05/04/2018) 120 mL 0  . diphenhydrAMINE (BENADRYL) 50 MG capsule Take 50 mg by mouth every 6 (six) hours as needed.    . Garcinia Cambogia-Chromium 500-200 MG-MCG TABS Take by mouth.    . LO LOESTRIN FE 1 MG-10 MCG / 10 MCG tablet Take 1 tablet by mouth once daily 84 tablet 0  . loratadine (CLARITIN) 10 MG tablet Take 10 mg by mouth as needed for allergies.    . meloxicam (MOBIC) 15 MG tablet Take 1 tablet (15 mg total) by mouth daily. (Patient not taking: Reported on 04/04/2018) 30 tablet 3  . Phenylephrine-APAP-guaiFENesin (TYLENOL SINUS SEVERE PO) Take by mouth.     No current facility-administered medications on file prior to visit.     ALLERGIES: No Known Allergies  FAMILY HISTORY: Family History  Problem Relation Age of Onset  . Seizures Father   . Heart disease Maternal Grandmother   . Diabetes Maternal Grandmother   . Stroke Maternal Grandmother   . Hypertension Maternal Grandmother   . Heart disease Maternal Grandfather   . Diabetes Maternal Grandfather   . Stroke Maternal Grandfather   . Heart disease Paternal Grandmother   . Diabetes Paternal Grandmother   . Heart disease Paternal Grandfather   . Diabetes Paternal Grandfather     SOCIAL HISTORY: Social History   Socioeconomic History  . Marital status: Single    Spouse name: Not  on file  . Number of children: Not on file  . Years of education: Not on file  . Highest education level: Not on file  Occupational History  . Not on file  Social Needs  . Financial resource strain: Not on file  . Food insecurity    Worry: Not on file    Inability: Not on file  . Transportation needs    Medical: Not on file    Non-medical: Not on file  Tobacco Use  . Smoking status: Former Smoker    Packs/day: 1.00    Years: 10.00    Pack years: 10.00     Types: Cigarettes, E-cigarettes  . Smokeless tobacco: Never Used  Substance and Sexual Activity  . Alcohol use: Yes    Comment: rarely  . Drug use: No  . Sexual activity: Not Currently    Birth control/protection: Pill  Lifestyle  . Physical activity    Days per week: Not on file    Minutes per session: Not on file  . Stress: Not on file  Relationships  . Social Musicianconnections    Talks on phone: Not on file    Gets together: Not on file    Attends religious service: Not on file    Active member of club or organization: Not on file    Attends meetings of clubs or organizations: Not on file    Relationship status: Not on file  . Intimate partner violence    Fear of current or ex partner: Not on file    Emotionally abused: Not on file    Physically abused: Not on file    Forced sexual activity: Not on file  Other Topics Concern  . Not on file  Social History Narrative  . Not on file    REVIEW OF SYSTEMS: Constitutional: No fevers, chills, or sweats, no generalized fatigue, change in appetite Eyes: No visual changes, double vision, eye pain Ear, nose and throat: No hearing loss, ear pain, nasal congestion, sore throat Cardiovascular: No chest pain, palpitations Respiratory:  No shortness of breath at rest or with exertion, wheezes GastrointestinaI: No nausea, vomiting, diarrhea, abdominal pain, fecal incontinence Genitourinary:  No dysuria, urinary retention or frequency Musculoskeletal:  No neck pain, back pain Integumentary: No rash, pruritus, skin lesions Neurological: as above Psychiatric: No depression, insomnia, anxiety Endocrine: No palpitations, fatigue, diaphoresis, mood swings, change in appetite, change in weight, increased thirst Hematologic/Lymphatic:  No purpura, petechiae. Allergic/Immunologic: no itchy/runny eyes, nasal congestion, recent allergic reactions, rashes  PHYSICAL EXAM: Blood pressure (!) 137/99, pulse 94, temperature 98.3 F (36.8 C), height 5'  3" (1.6 m), weight 177 lb (80.3 kg), SpO2 99 %. General: No acute distress.  Patient appears well-groomed. Head:  Normocephalic/atraumatic Eyes:  fundi examined but not visualized Neck: supple, no paraspinal tenderness, full range of motion Back: No paraspinal tenderness Heart: regular rate and rhythm Lungs: Clear to auscultation bilaterally. Vascular: No carotid bruits. Neurological Exam: Mental status: alert and oriented to person, place, and time, recent and remote memory intact, fund of knowledge intact, attention and concentration intact, speech fluent and not dysarthric, language intact. Cranial nerves: CN I: not tested CN II: pupils equal, round and reactive to light, visual fields intact CN III, IV, VI:  full range of motion, no nystagmus, no ptosis CN V: facial sensation intact CN VII: upper and lower face symmetric CN VIII: hearing intact CN IX, X: gag intact, uvula midline CN XI: sternocleidomastoid and trapezius muscles intact CN XII: tongue midline Bulk &  Tone: normal, no fasciculations. Motor:  5/5 throughout  Sensation: temperature and vibration sensation intact.  Deep Tendon Reflexes:  2+ throughout, toes downgoing.   Finger to nose testing:  Without dysmetria.   Heel to shin:  Without dysmetria.   Gait:  Normal station and stride.  Able to turn. Romberg negative  IMPRESSION: 1.  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  Diplopia, likely secondary to increased intracranial pressure.  PLAN: 1.  MRV head to rule out venous sinus thrombosis 2.  Lumbar puncture to measure opening pressure and check CSF analysis for cell count, protein, glucose, gram stain and culture. 3.  After assessing opening pressure, will start acetazolamide 500mg  twice daily 4.  Although diplopia likely secondary to IIH, will still check myasthenia gravis panel. 5.  Advised to stop her birth control and start weight loss program. 6.  Follow up in 4 months.  Thank you for  allowing me to take part in the care of this patient.  Metta Clines, DO  CC: Sharilyn Sites, MD

## 2018-12-16 ENCOUNTER — Other Ambulatory Visit (INDEPENDENT_AMBULATORY_CARE_PROVIDER_SITE_OTHER): Payer: No Typology Code available for payment source

## 2018-12-16 ENCOUNTER — Ambulatory Visit (INDEPENDENT_AMBULATORY_CARE_PROVIDER_SITE_OTHER): Payer: No Typology Code available for payment source | Admitting: Neurology

## 2018-12-16 ENCOUNTER — Encounter: Payer: Self-pay | Admitting: Neurology

## 2018-12-16 ENCOUNTER — Other Ambulatory Visit: Payer: Self-pay

## 2018-12-16 VITALS — BP 137/99 | HR 94 | Temp 98.3°F | Ht 63.0 in | Wt 177.0 lb

## 2018-12-16 DIAGNOSIS — H532 Diplopia: Secondary | ICD-10-CM

## 2018-12-16 DIAGNOSIS — H4710 Unspecified papilledema: Secondary | ICD-10-CM

## 2018-12-16 NOTE — Patient Instructions (Addendum)
Most likely idiopathic intracranial hypertension, however we will check blood work for myasthenia gravis as well.  Will set you up for a spinal tap to check opening pressure and check spinal fluid for cell count, protein, glucose, and gram stain and culture.  After spinal tap, will start acetazolamide to treat increased pressure.  Check MRV of head  [facility] will call you [phone])  Start trying to lose weight  Stop birth control medication  Follow up in 4 months.   Idiopathic Intracranial Hypertension  Idiopathic intracranial hypertension (IIH) is a condition that increases pressure around the brain. The fluid that surrounds the brain and spinal cord (cerebrospinal fluid, CSF) increases and causes the pressure. Idiopathic means that the cause of this condition is not known. IIH affects the brain and spinal cord (is a neurological disorder). If this condition is not treated, it can cause vision loss or blindness. What increases the risk? You are more likely to develop this condition if:  You are severely overweight (obese).  You are a woman who has not gone through menopause.  You take certain medicines, such as birth control or steroids. What are the signs or symptoms? Symptoms of IIH include:  Headaches. This is the most common symptom.  Pain in the shoulders or neck.  Nausea and vomiting.  A "rushing water" or pulsing sound within the ears (pulsatile tinnitus).  Double vision.  Blurred vision.  Brief episodes of complete vision loss. How is this diagnosed? This condition may be diagnosed based on:  Your symptoms.  Your medical history.  CT scan of the brain.  MRI of the brain.  Magnetic resonance venogram (MRV) to check veins in the brain.  Diagnostic lumbar puncture. This is a procedure to remove and examine a sample of cerebrospinal fluid. This procedure can determine whether too much fluid may be causing IIH.  A thorough eye exam to check  for swelling or nerve damage in the eyes. How is this treated? Treatment for this condition depends on your symptoms. The goal of treatment is to decrease the pressure around your brain. Common treatments include:  Medicines to decrease the production of spinal fluid and lower the pressure within your skull.  Medicines to prevent or treat headaches.  Surgery to place drains (shunts) in your brain to remove excess fluid.  Lumbar puncture to remove excess cerebrospinal fluid. Follow these instructions at home:  If you are overweight or obese, work with your health care provider to lose weight.  Take over-the-counter and prescription medicines only as told by your health care provider.  Do not drive or use heavy machinery while taking medicines that can make you sleepy.  Keep all follow-up visits as told by your health care provider. This is important. Contact a health care provider if:  You have changes in your vision, such as: ? Double vision. ? Not being able to see colors (color vision). Get help right away if:  You have any of the following symptoms and they get worse or do not get better. ? Headaches. ? Nausea. ? Vomiting. ? Vision changes or difficulty seeing. Summary  Idiopathic intracranial hypertension (IIH) is a condition that increases pressure around the brain. The cause is not known (is idiopathic).  The most common symptom of IIH is headaches.  Treatment may include medicines or surgery to relieve the pressure on your brain. This information is not intended to replace advice given to you by your health care provider. Make sure you discuss any  questions you have with your health care provider. Document Released: 06/04/2001 Document Revised: 03/08/2017 Document Reviewed: 02/15/2016 Elsevier Patient Education  2020 [employer].

## 2018-12-19 LAB — STRIATED MUSCLE ANTIBODY: STRIATED MUSCLE AB SCREEN: NEGATIVE

## 2018-12-20 LAB — ACETYLCHOLINE RECEPTOR, BINDING: A CHR BINDING ABS: <0.3 nmol/L

## 2019-01-09 ENCOUNTER — Telehealth: Payer: Self-pay | Admitting: *Deleted

## 2019-01-09 ENCOUNTER — Other Ambulatory Visit: Payer: Self-pay

## 2019-01-09 ENCOUNTER — Ambulatory Visit
Admission: RE | Admit: 2019-01-09 | Discharge: 2019-01-09 | Disposition: A | Discharge status: Home / Self Care | Payer: No Typology Code available for payment source | Source: Ambulatory Visit | Attending: Neurology | Admitting: Neurology

## 2019-01-09 DIAGNOSIS — H532 Diplopia: Secondary | ICD-10-CM

## 2019-01-09 DIAGNOSIS — H4710 Unspecified papilledema: Secondary | ICD-10-CM

## 2019-01-09 NOTE — Telephone Encounter (Signed)
-----   Message from Pieter Partridge, DO sent at 01/09/2019 12:57 PM EDT ----- MRV of brain shows no evidence of blood clot.

## 2019-01-09 NOTE — Telephone Encounter (Signed)
Spoke to patient and informed her MRV of brain shows no evidence of blood clot . Verbalizes understanding.

## 2019-01-12 ENCOUNTER — Ambulatory Visit
Admission: RE | Admit: 2019-01-12 | Discharge: 2019-01-12 | Disposition: A | Discharge status: Home / Self Care | Payer: No Typology Code available for payment source | Source: Ambulatory Visit | Attending: Neurology | Admitting: Neurology

## 2019-01-12 ENCOUNTER — Other Ambulatory Visit: Payer: Self-pay

## 2019-01-12 VITALS — BP 137/88 | HR 87

## 2019-01-12 DIAGNOSIS — H532 Diplopia: Secondary | ICD-10-CM

## 2019-01-12 DIAGNOSIS — H4710 Unspecified papilledema: Secondary | ICD-10-CM

## 2019-01-12 NOTE — Discharge Instructions (Signed)

## 2019-01-13 ENCOUNTER — Telehealth: Payer: Self-pay | Admitting: *Deleted

## 2019-01-13 ENCOUNTER — Other Ambulatory Visit: Payer: Self-pay | Admitting: *Deleted

## 2019-01-13 DIAGNOSIS — H532 Diplopia: Secondary | ICD-10-CM

## 2019-01-13 DIAGNOSIS — H4710 Unspecified papilledema: Secondary | ICD-10-CM

## 2019-01-13 MED ORDER — ACETAZOLAMIDE ER 500 MG PO CP12: 500.0000 mg | ORAL_CAPSULE | Freq: Two times a day (BID) | ORAL | 2 refills | Status: DC

## 2019-01-13 NOTE — Telephone Encounter (Addendum)
  Spoke to patient and relayed all below information from MD regarding her spinal tap results and  starting on acetazolamine ER. Reviewed numbness and tingling may be side effect. Verbalized understanding. Confirmed [facility] in [place]. Medication ordered and sent to them.  Patient is calling Dr. Kellie Moor office now to make appt in 6-8 weeks. She will request they sent results to Dr. Tomi Likens.    ----- Message from Pieter Partridge, DO sent at 01/12/2019 10:39 AM EDT -----     Spinal tap does reveal elevated intracranial pressure.  I would like to start acetazolamide ER 500mg  twice daily.  As discussed, numbness and tingling may be a side effect, so she should not worry if she experiences this.  It typically resolves once body is adjusted to the dose.  After starting it, I want her to have a repeat eye exam with Dr. Gershon Crane in 6 to 8 weeks and have his note sent to me.

## 2019-01-14 ENCOUNTER — Telehealth: Payer: Self-pay | Admitting: Neurology

## 2019-01-14 ENCOUNTER — Telehealth: Payer: Self-pay

## 2019-01-14 ENCOUNTER — Other Ambulatory Visit: Payer: Self-pay | Admitting: *Deleted

## 2019-01-14 DIAGNOSIS — H4710 Unspecified papilledema: Secondary | ICD-10-CM

## 2019-01-14 DIAGNOSIS — H532 Diplopia: Secondary | ICD-10-CM

## 2019-01-14 NOTE — Telephone Encounter (Signed)
Patient had LP 10/5. Dull headache 10/6 but increased last night at 10pm. This am woke up and has pounding headache if standing and dull if lying down.  Dr Tomi Likens please see detailed notes from radiology @1114  Brita Romp) who spoke to patient about options. They told patient to call here and ask you how to proceed.  Patient also wants to know should she start acetazolamine ER that was sent in yesterday (planning to pick up today).

## 2019-01-14 NOTE — Telephone Encounter (Signed)
MD stated for patient to have blood patch done at [facility]. Called Roberta at [facility] [phone]. Order placed for blood patch. She is going to call patient and schedule.  Called patient and informed her Dr. Tomi Likens wants her to have the blood patch and that she can start the med(prescribed yesterday) after the patch is done. Patient verbalized understanding of above.

## 2019-01-14 NOTE — Telephone Encounter (Signed)
Patient called regarding her having a Lumbar Puncture and now having a headache. She said she was told to call our office. Her number is [phone]. Thanks

## 2019-01-14 NOTE — Telephone Encounter (Signed)
Alexis Gomez called to report a positional headache that started last night after she had an LP with [place] 10/5.  We talked about her opening pressure being 29.5cm H2O and her closing pressure being lowered to 11.5.  We also discussed the epidural blood patch procedure versus continued bedrest and/or Dr. Tomi Likens ordering medications to treat her headache.  She did say she is supposed to go to his office today to pick up a prescription for acetazolamide.  She said she would call his office to let them know about her headache and to see how they wanted to proceed; also, to see if she should start this new medication now or wait until her positional headache subsides.

## 2019-01-15 ENCOUNTER — Other Ambulatory Visit: Payer: Self-pay

## 2019-01-15 ENCOUNTER — Ambulatory Visit
Admission: RE | Admit: 2019-01-15 | Discharge: 2019-01-15 | Disposition: A | Discharge status: Home / Self Care | Payer: No Typology Code available for payment source | Source: Ambulatory Visit | Attending: Neurology | Admitting: Neurology

## 2019-01-15 DIAGNOSIS — H532 Diplopia: Secondary | ICD-10-CM

## 2019-01-15 DIAGNOSIS — H4710 Unspecified papilledema: Secondary | ICD-10-CM

## 2019-01-15 MED ORDER — IOPAMIDOL (ISOVUE-M 200) INJECTION 41%
1.0000 mL | Freq: Once | INTRAMUSCULAR | Status: AC
  Administered 2019-01-15: 1 mL via EPIDURAL

## 2019-01-15 NOTE — Discharge Instructions (Signed)

## 2019-01-15 NOTE — Progress Notes (Signed)
Blood obtained from pt's R AC for blood patch. Pt tolerated procedure well. Site is unremarkable. 

## 2019-01-16 ENCOUNTER — Telehealth: Payer: Self-pay | Admitting: *Deleted

## 2019-01-16 LAB — GLUCOSE, CSF: Glucose, CSF: 62 mg/dL (ref 40–80)

## 2019-01-16 LAB — CSF CELL COUNT WITH DIFFERENTIAL
RBC Count, CSF: 0 cells/uL (ref 0–10)
WBC, CSF: 1 cells/uL (ref 0–5)

## 2019-01-16 LAB — PROTEIN, CSF: Total Protein, CSF: 26 mg/dL (ref 15–45)

## 2019-01-16 LAB — CSF CULTURE W GRAM STAIN
MICRO NUMBER:: 953995
Result:: NO GROWTH
SPECIMEN QUALITY:: ADEQUATE

## 2019-01-16 NOTE — Telephone Encounter (Signed)
Returned call left on voice mail from [facility] in regards to patient. Reference [id number] L.  Was left message to call back [place].  Was in regards to LP - Dr Tomi Likens was available and also heard the results from [place]. He is able to view in Epic also.

## 2019-02-20 MED FILL — ACETAZOLAMIDE ER 500 MG CAP: 500 | 30 days supply | Qty: 60 | Fill #0

## 2019-03-02 ENCOUNTER — Telehealth: Payer: Self-pay | Admitting: Neurology

## 2019-03-02 NOTE — Telephone Encounter (Signed)
Patient called and said she has questions about reducing her acetazolamide 500 MG medication. She said it is causing her diarrhea. Patient stated she saw an eye doctor and he will be sending a report to Dr. Tomi Likens.  Melvin

## 2019-03-03 MED ORDER — ACETAZOLAMIDE 250 MG PO TABS: 250.0000 mg | ORAL_TABLET | Freq: Two times a day (BID) | ORAL | 5 refills | Status: DC

## 2019-03-03 MED FILL — CHLORHEXIDINE 0.12% RINSE: 0.12 | 16 days supply | Qty: 473 | Fill #0

## 2019-03-03 MED FILL — HYDROCODON-APAP 5-325: 5-325 | 2 days supply | Qty: 7 | Fill #0

## 2019-03-03 MED FILL — acetaZOLAMIDE 250 MG TABS: 250 | 30 days supply | Qty: 60 | Fill #0

## 2019-03-03 MED FILL — IBUPROFEN 400 MG TABS: 400 | 5 days supply | Qty: 30 | Fill #0

## 2019-03-03 MED FILL — DEXAMETHASONE 4 MG TABLET: 4 | 3 days supply | Qty: 9 | Fill #0

## 2019-03-03 MED FILL — AMOXICILLIN 500 MG CAPSULE: 500 | 7 days supply | Qty: 21 | Fill #0

## 2019-03-03 NOTE — Telephone Encounter (Signed)
Called patient no answer left message to call office back to discuss provider response below 

## 2019-03-03 NOTE — Telephone Encounter (Signed)
I received the ophthalmology note from Dr. Gershon Crane regarding visit from 03/02/2019.  No evidence of papilledema on exam.  Patient reports resolution of diplopia.  I am therefore okay with reducing dose of acetazolamide to 250mg  twice daily.  I would recommend another eye exam in 6 weeks.  If exam still okay at that time, then we can discontinue the acetazolamide.  We can discuss further at her follow up appointment with me in early January.

## 2019-03-03 NOTE — Telephone Encounter (Signed)
Called spoke with patient she was informed of provider response.  New Rx sent patient has a capsule that a tablet.  Pharmacy verified

## 2019-03-31 MED FILL — acetaZOLAMIDE 250 MG TABS: 250 | 30 days supply | Qty: 60 | Fill #1

## 2019-04-17 ENCOUNTER — Encounter: Payer: Self-pay | Admitting: Neurology

## 2019-04-17 NOTE — Progress Notes (Addendum)
Virtual Visit via Video Note The purpose of this virtual visit is to provide medical care while limiting exposure to the novel coronavirus.    Consent was obtained for video visit:  Yes.   Answered questions that patient had about telehealth interaction:  Yes.   I discussed the limitations, risks, security and privacy concerns of performing an evaluation and management service by telemedicine. I also discussed with the patient that there may be a patient responsible charge related to this service. The patient expressed understanding and agreed to proceed.  Pt location: Home Physician Location: office Name of referring provider:  Sharilyn Sites, MD I connected with Alexis Gomez at patients initiation/request on 04/20/2019 at 10:10 AM EST by video enabled telemedicine application and verified that I am speaking with the correct person using two identifiers. Pt MRN:  [id number] Pt DOB:  [birth year] Video Participants:  Alexis Gomez   History of Present Illness:  Alexis Gomez is a 34 year old female who follows up for papilledema.  UPDATE: Because she endorsed double vision, myasthenia panel was checked on 12/16/2018, which was negative. 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 Lumbar puncture from 01/12/2019 demonstrated elevated opening pressure of 29.5 cm H2O.  CSF analysis was normal (including cellc ount, protein, glucose and culture).    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  She was started on acetazolamide 500mg  twice daily on 01/13/2019.  She had a repeat eye exam by Dr. Gershon Crane on 03/02/2019 which demonstrated resolution of papilledema.  Patient also endorsed resolution of diplopia.  Acetazolamide was decreased to 250mg  twice daily.  No issues.    HISTORY: In August 2020, she began experiencing double vision, described as vertical, occurs  whether she is walking or sitting and watching TV.  Symptoms are intermittent.  They occur daily, lasting a minute or two off and on throughout the day.  No visual obscurations but sometimes floaters.  She notes hearing whooshing sounds in her ears.  About one week prior to onset of double vision, she had a headache, moderate to severe bifrontal/temporal associated with nausea and vomiting.  It subsequently resolved once the double vision started.  She denies history of headaches.  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 She was evaluated by ophthalmologist, Dr. Rutherford Guys, on 12/02/18, who noted bilateral myopia, but also exam demonstrated bilateral papilledema.  Visual fields were full.  No significant weight gain.  She is taking Lo Loestrin Fe.   Past Medical History: Past Medical History:  Diagnosis Date  . Boil of buttock 04/28/2015  . History of abnormal cervical Pap smear 05/19/2013   Has had laser cone in past  . Vaginal Pap smear, abnormal     Medications: Outpatient Encounter Medications as of 04/20/2019  Medication Sig  . acetaZOLAMIDE (DIAMOX) 250 MG tablet Take 1 tablet (250 mg total) by mouth 2 (two) times daily.  . diphenhydrAMINE (BENADRYL) 50 MG capsule Take 50 mg by mouth every 6 (six) hours as needed.  . LO LOESTRIN FE 1 MG-10 MCG / 10 MCG tablet Take 1 tablet by mouth once daily  . loratadine (CLARITIN) 10 MG tablet Take 10 mg by mouth as needed for allergies.  Marland Kitchen ondansetron (ZOFRAN) 4  MG tablet Take 4 mg by mouth every 6 (six) hours as needed. for nausea  . Phenylephrine-APAP-guaiFENesin (TYLENOL SINUS SEVERE PO) Take by mouth.   No facility-administered encounter medications on file as of 04/20/2019.    Allergies: No Known Allergies  Family History: Family History  Problem Relation Age of  Onset  . Seizures Father   . Heart disease Maternal Grandmother   . Diabetes Maternal Grandmother   . Stroke Maternal Grandmother   . Hypertension Maternal Grandmother   . Heart disease Maternal Grandfather   . Diabetes Maternal Grandfather   . Stroke Maternal Grandfather   . Heart disease Paternal Grandmother   . Diabetes Paternal Grandmother   . Heart disease Paternal Grandfather   . Diabetes Paternal Grandfather     Social History: Social History   Socioeconomic History  . Marital status: Single    Spouse name: Not on file  . Number of children: Not on file  . Years of education: Not on file  . Highest education level: Not on file  Occupational History  . Not on file  Tobacco Use  . Smoking status: Current Every Day Smoker    Packs/day: 1.00    Years: 10.00    Pack years: 10.00    Types: Cigarettes, E-cigarettes  . Smokeless tobacco: Never Used  . Tobacco comment: less than 1PPD  Substance and Sexual Activity  . Alcohol use: Yes    Comment: rarely  . Drug use: No  . Sexual activity: Not Currently    Birth control/protection: Pill  Other Topics Concern  . 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 Reino Kent daily; Exercise - not now   Social Determinants of Health   Financial Resource Strain:   . Difficulty of Paying Living Expenses: Not on file  Food Insecurity:   . Worried About Programme researcher, broadcasting/film/video in the Last Year: Not on file  . Ran Out of Food in the Last Year: Not on file  Transportation Needs:   . Lack of Transportation (Medical): Not on file  . Lack of Transportation (Non-Medical): Not on file  Physical Activity:   . Days of Exercise per Week: Not on file  . Minutes of Exercise per Session: Not on file  Stress:   . Feeling of Stress : Not on file  Social Connections:   . Frequency of Communication with Friends and Family: Not on file  . Frequency of  Social Gatherings with Friends and Family: Not on file  . Attends Religious Services: Not on file  . Active Member of Clubs or Organizations: Not on file  . Attends Banker Meetings: Not on file  . Marital Status: Not on file  Intimate Partner Violence:   . Fear of Current or Ex-Partner: Not on file  . Emotionally Abused: Not on file  . Physically Abused: Not on file  . Sexually Abused: Not on file    Observations/Objective:   There were no vitals taken for this visit. No acute distress.  Alert and oriented.  Speech fluent and not dysarthric.  Language intact.   Assessment and Plan:   Idiopathic intracranial hypertension  1.  Acetazolamide 250mg  twice daily for now.  Advised that she make a follow up appointment with Dr. in 2 weeks for repeat eye exam.  If papilledema still resolved, will discontinue acetazolamide completely and will have her have another  eye exam 2 months after that.  05/11/2019 ADDENDUM:  Repeat eye exam with Dr. Nile Riggs on 05/04/2019 again revealed no papilledema.  Will have her discontinue acetazolamide and have her repeat eye exam in 2 months. 2.  Advised to remain off of birth control medication. 3.  Follow up in 3 to 4 months.  Follow Up Instructions:    -I discussed the assessment and treatment plan with the patient. The patient was provided an opportunity to ask questions and all were answered. The patient agreed with the plan and demonstrated an understanding of the instructions.   The patient was advised to call back or seek an in-person evaluation if the symptoms worsen or if the condition fails to improve as anticipated.   Cira Servant, DO

## 2019-04-20 ENCOUNTER — Encounter: Payer: Self-pay | Admitting: Neurology

## 2019-04-20 ENCOUNTER — Telehealth (INDEPENDENT_AMBULATORY_CARE_PROVIDER_SITE_OTHER): Payer: No Typology Code available for payment source | Admitting: Neurology

## 2019-04-20 ENCOUNTER — Other Ambulatory Visit: Payer: Self-pay

## 2019-04-20 DIAGNOSIS — G932 Benign intracranial hypertension: Secondary | ICD-10-CM

## 2019-05-11 ENCOUNTER — Telehealth: Payer: Self-pay

## 2019-05-11 NOTE — Telephone Encounter (Signed)
I contacted Patient per Dr.Jaffe she can stop Acetazolamide. Patient aware and understood.

## 2019-05-11 NOTE — Telephone Encounter (Signed)
-----   Message from Drema Dallas, DO sent at 05/11/2019 11:12 AM EST ----- Please let patient know that I reviewed Dr. Ashley Royalty note and that she may discontinue acetazolamide.  She should have repeat eye exam with Dr. Nile Riggs in 2 months.

## 2019-05-17 ENCOUNTER — Emergency Department (HOSPITAL_COMMUNITY)
Admission: EM | Admit: 2019-05-17 | Discharge: 2019-05-17 | Disposition: A | Discharge status: Home / Self Care | Payer: No Typology Code available for payment source | Attending: Emergency Medicine | Admitting: Emergency Medicine

## 2019-05-17 ENCOUNTER — Encounter (HOSPITAL_COMMUNITY): Payer: Self-pay | Admitting: Emergency Medicine

## 2019-05-17 ENCOUNTER — Emergency Department (HOSPITAL_COMMUNITY): Payer: No Typology Code available for payment source

## 2019-05-17 ENCOUNTER — Other Ambulatory Visit: Payer: Self-pay

## 2019-05-17 DIAGNOSIS — M545 Low back pain, unspecified: Secondary | ICD-10-CM

## 2019-05-17 DIAGNOSIS — Z79899 Other long term (current) drug therapy: Secondary | ICD-10-CM | POA: Insufficient documentation

## 2019-05-17 DIAGNOSIS — F17210 Nicotine dependence, cigarettes, uncomplicated: Secondary | ICD-10-CM | POA: Insufficient documentation

## 2019-05-17 LAB — POC URINE PREG, ED: Preg Test, Ur: NEGATIVE

## 2019-05-17 MED ORDER — KETOROLAC TROMETHAMINE 30 MG/ML IJ SOLN
30.0000 mg | Freq: Once | INTRAMUSCULAR | Status: AC
  Administered 2019-05-17: 30 mg via INTRAMUSCULAR
  Filled 2019-05-17: qty 1

## 2019-05-17 MED ORDER — LIDOCAINE 5 % EX PTCH: 1.0000 | MEDICATED_PATCH | CUTANEOUS | 0 refills | Status: DC

## 2019-05-17 MED ORDER — NAPROXEN 500 MG PO TABS: 500.0000 mg | ORAL_TABLET | Freq: Two times a day (BID) | ORAL | 0 refills | Status: DC

## 2019-05-17 MED ORDER — METHOCARBAMOL 500 MG PO TABS: 500.0000 mg | ORAL_TABLET | Freq: Two times a day (BID) | ORAL | 0 refills | Status: DC

## 2019-05-17 MED ORDER — HYDROCODONE-ACETAMINOPHEN 5-325 MG PO TABS
1.0000 | ORAL_TABLET | Freq: Once | ORAL | Status: AC
  Administered 2019-05-17: 1 via ORAL
  Filled 2019-05-17: qty 1

## 2019-05-17 NOTE — ED Provider Notes (Signed)
Alexis Gomez Alexis Gomez   CSN: [id number] Arrival date & time: 05/17/19  1135     History Chief Complaint  Patient presents with  . Back Pain    Alexis Gomez is Gomez 34 y.o. female with no significant past medical history who presents for evaluation of back pain.  Patient states she was bending over to start Gomez tub when she felt severe right-sided back pain.  Pain located to midline lumbar spine.  States she possibly heard Gomez "pop."  She denies any radicular symptoms.  Denies fever, chills, nausea, vomiting, chest pain, shortness of breath, abdominal pain, diarrhea, dysuria, pelvic pain, vaginal discharge, IV drug use, bowel or bladder incontinence, saddle paresthesias, paresthesias to extremities, redness, swelling or warmth to extremities.  She rates her pain Gomez 10/10.  Has not taken anything.  Denies additional aggravating or alleviating factors.  Pain worse with movement, particularly walking and bending.  No recent falls or MVC's.  History obtained from patient and past medical records.  No interpreter is used.  HPI     Past Medical History:  Diagnosis Date  . Boil of buttock 04/28/2015  . History of abnormal cervical Pap smear 05/19/2013   Has had laser cone in past  . Vaginal Pap smear, abnormal     Patient Active Problem List   Diagnosis Date Noted  . Boil of buttock 04/28/2015  . Contraceptive management 05/19/2013  . History of abnormal cervical Pap smear 05/19/2013    Past Surgical History:  Procedure Laterality Date  . COLPOSCOPY    . laser cone       OB History    Gravida  0   Para  0   Term  0   Preterm  0   AB  0   Living  0     SAB  0   TAB  0   Ectopic  0   Multiple  0   Live Births              Family History  Problem Relation Age of Onset  . Seizures Father   . Heart disease Maternal Grandmother   . Diabetes Maternal Grandmother   . Stroke Maternal Grandmother   . Hypertension Maternal  Grandmother   . Heart disease Maternal Grandfather   . Diabetes Maternal Grandfather   . Stroke Maternal Grandfather   . Heart disease Paternal Grandmother   . Diabetes Paternal Grandmother   . Heart disease Paternal Grandfather   . Diabetes Paternal Grandfather     Social History   Tobacco Use  . Smoking status: Current Every Day Smoker    Packs/day: 1.00    Years: 10.00    Pack years: 10.00    Types: Cigarettes, E-cigarettes  . Smokeless tobacco: Never Used  . Tobacco comment: less than 1PPD  Substance Use Topics  . Alcohol use: Yes    Comment: rarely  . Drug use: No    Home Medications Prior to Admission medications   Medication Sig Start Date End Date Taking? Authorizing Alexis  acetaZOLAMIDE (DIAMOX) 250 MG tablet Take 1 tablet (250 mg total) by mouth 2 (two) times daily. 03/03/19   Alexis Partridge, Alexis Gomez  diphenhydrAMINE (BENADRYL) 50 MG capsule Take 50 mg by mouth every 6 (six) hours as needed.    Alexis, Historical, MD  lidocaine (LIDODERM) 5 % Place 1 patch onto the skin daily. Remove & Discard patch within 12 hours or as directed by MD 05/17/19  Alexis Paolini Gomez, Alexis Gomez  LO LOESTRIN FE 1 MG-10 MCG / 10 MCG tablet Take 1 tablet by mouth once daily 08/08/18   Alexis Mourning Gomez, Alexis Gomez  loratadine (CLARITIN) 10 MG tablet Take 10 mg by mouth as needed for allergies.    Alexis, Historical, MD  methocarbamol (ROBAXIN) 500 MG tablet Take 1 tablet (500 mg total) by mouth 2 (two) times daily. 05/17/19   Alexis Bisig Gomez, Alexis Gomez  naproxen (NAPROSYN) 500 MG tablet Take 1 tablet (500 mg total) by mouth 2 (two) times daily. 05/17/19   Alexis Rideaux Gomez, Alexis Gomez  ondansetron (ZOFRAN) 4 MG tablet Take 4 mg by mouth every 6 (six) hours as needed. for nausea 11/20/18   Alexis, Historical, MD  Phenylephrine-APAP-guaiFENesin (TYLENOL SINUS SEVERE PO) Take by mouth.    Alexis, Historical, MD    Allergies    Patient has no known allergies.  Review of Systems   Review of Systems    Constitutional: Negative.   HENT: Negative.   Respiratory: Negative.   Cardiovascular: Negative.   Gastrointestinal: Negative.   Genitourinary: Negative.   Musculoskeletal: Positive for back pain. Negative for arthralgias, gait problem, joint swelling, myalgias, neck pain and neck stiffness.  Skin: Negative.   Neurological: Negative.   All other systems reviewed and are negative.   Physical Exam Updated Vital Signs BP (!) 153/79 (BP Location: Right Arm)   Pulse 98   Temp 98.3 F (36.8 C) (Oral)   Resp 14   LMP 04/26/2019   SpO2 100%   Physical Exam Physical Exam  Constitutional: Pt appears well-developed and well-nourished. No distress.  HENT:  Head: Normocephalic and atraumatic.  Mouth/Throat: Oropharynx is clear and moist. No oropharyngeal exudate.  Eyes: Conjunctivae are normal.  Neck: Normal range of motion. Neck supple.  Full ROM without pain  Cardiovascular: Normal rate, regular rhythm and intact distal pulses.   Pulmonary/Chest: Effort normal and breath sounds normal. No respiratory distress. Pt has no wheezes.  Abdominal: Soft. Pt exhibits no distension. There is no tenderness, rebound or guarding. No abd bruit or pulsatile mass Musculoskeletal:  Full range of motion of the T-spine and L-spine with flexion, hyperextension, and lateral flexion. No midline tenderness or stepoffs. No tenderness to palpation of the spinous processes of the T-spine or L-spine. Moderate diffuse tenderness to palpation of the paraspinous muscles of the L-spine. Negative straight leg raise. Lymphadenopathy:    Pt has no cervical adenopathy.  Neurological: Pt is alert. Pt has normal reflexes.  Reflex Scores:      Bicep reflexes are 2+ on the right side and 2+ on the left side.      Brachioradialis reflexes are 2+ on the right side and 2+ on the left side.      Patellar reflexes are 2+ on the right side and 2+ on the left side.      Achilles reflexes are 2+ on the right side and 2+ on the  left side. Speech is clear and goal oriented, follows commands Normal 5/5 strength in upper and lower extremities bilaterally including dorsiflexion and plantar flexion, strong and equal grip strength Sensation normal to light and sharp touch Moves extremities without ataxia, coordination intact Normal gait Normal balance No Clonus Skin: Skin is warm and dry. No rash noted or lesions noted. Pt is not diaphoretic. No erythema, ecchymosis,edema or warmth.  Psychiatric: Pt has Gomez normal mood and affect. Behavior is normal.  Nursing Gomez and vitals reviewed. ED Results / Procedures / Treatments   Labs (all  labs ordered are listed, but only abnormal results are displayed) Labs Reviewed  POC URINE PREG, ED    EKG None  Radiology DG Lumbar Spine Complete  Result Date: 05/17/2019 CLINICAL DATA:  Back pain after bending over yesterday. EXAM: LUMBAR SPINE - COMPLETE 4+ VIEW COMPARISON:  None. FINDINGS: Mild lumbo levocurvature which may be positional. No evidence of fracture, erosion, or bone lesion. No degenerative changes. IMPRESSION: No acute or focal finding. Mild levocurvature which could be positional. Electronically Signed   By: Alexis Spring M.D.   On: 05/17/2019 14:37    Procedures Procedures (including critical care time)  Medications Ordered in ED Medications  HYDROcodone-acetaminophen (NORCO/VICODIN) 5-325 MG per tablet 1 tablet (1 tablet Oral Given 05/17/19 1315)  ketorolac (TORADOL) 30 MG/ML injection 30 mg (30 mg Intramuscular Given 05/17/19 1438)    ED Course  I have reviewed the triage vital signs and the nursing notes.  Pertinent labs & imaging results that were available during my care of the patient were reviewed by me and considered in my medical decision making (see chart for details).  34 year old female appears otherwise well presents for evaluation of back pain.  She is afebrile, nonseptic, non-ill-appearing.  No IV drug use, bowel or bladder incontinence, saddle  paresthesias.  Heart and lungs clear.  Abdomen soft.  She has normal musculoskeletal exam except for diffuse tenderness across her lumbar spine.  She also has some mild midline lumbar tenderness.  No crepitus or step-offs.  Negative straight leg raise.  No red flags for back pain, low suspicion for cauda equina, discitis, osteomyelitis, transverse myelitis, psoas abscess, acute intra-abdominal, vascular process, acute bacterial process.  Pain worse with standing and bending.  No overlying skin changes to back.  States she did bend she thought she heard Gomez "pop sensation."  No recent MVC's or falls.  Shared decision making for x-rays.  Patient elects for x-ray.  Will obtain urine pregnancy, pain meds.  High suspicion for MSK sprain or strain.  X-ray reassuring.  Pain significantly improved in the ED. Ambulatory without difficulty.  The patient has been appropriately medically screened and/or stabilized in the ED. I have low suspicion for any other emergent medical condition which would require further screening, evaluation or treatment in the ED or require inpatient management.  Patient is hemodynamically stable and in no acute distress.  Patient able to ambulate in Gomez prior to ED.  Evaluation does not show acute pathology that would require ongoing or additional emergent interventions while in the emergency Gomez or further inpatient treatment.  I have discussed the diagnosis with the patient and answered all questions.  Pain is been managed while in the emergency Gomez and patient has no further complaints prior to discharge.  Patient is comfortable with plan discussed in room and is stable for discharge at this time.  I have discussed strict return precautions for returning to the emergency Gomez.  Patient was encouraged to follow-up with PCP/specialist refer to at discharge.    MDM Rules/Calculators/Gomez&P                       Final Clinical Impression(s) / ED Diagnoses Final  diagnoses:  Acute bilateral low back pain without sciatica    Rx / DC Orders ED Discharge Orders         Ordered    naproxen (NAPROSYN) 500 MG tablet  2 times daily     05/17/19 1539    methocarbamol (ROBAXIN) 500 MG tablet  2 times daily     05/17/19 1539    lidocaine (LIDODERM) 5 %  Every 24 hours     05/17/19 1539           Eoin Willden Gomez, Alexis Gomez 05/17/19 1543    Tilden Fossa, MD 05/18/19 1203

## 2019-05-17 NOTE — ED Notes (Signed)
Patient verbalizes understanding of discharge instructions. Opportunity for questioning and answers were provided. Armband removed by staff, pt discharged from ED.  

## 2019-05-17 NOTE — Discharge Instructions (Signed)
Contact a health care provider if:  You have pain that is not relieved with rest or medicine.  You have increasing pain going down into your legs or buttocks.  Your pain does not improve after 2 weeks.  You have pain at night.  You lose weight without trying.  You have a fever or chills.  Get help right away if:  You develop new bowel or bladder control problems.  You have unusual weakness or numbness in your arms or legs.  You develop nausea or vomiting.  You develop abdominal pain.  You feel faint.

## 2019-05-17 NOTE — ED Triage Notes (Signed)
C/o lower back pain since yesterday.  States she bent over to put stopper in bathtub when pain started.  Denies urinary complaints.  Pain worse with movement.

## 2019-06-26 MED FILL — AMOXICILLIN 500 MG CAPSULE: 500 | 7 days supply | Qty: 21 | Fill #0

## 2019-06-26 MED FILL — HYDROCODON-APAP 5-325: 5-325 | 2 days supply | Qty: 7 | Fill #0

## 2019-06-26 MED FILL — CHLORHEXIDINE 0.12% RINSE: 0.12 | 15 days supply | Qty: 473 | Fill #0

## 2019-06-26 MED FILL — IBUPROFEN 400 MG TABS: 400 | 5 days supply | Qty: 30 | Fill #0

## 2019-06-26 MED FILL — DEXAMETHASONE 4 MG TABLET: 4 | 3 days supply | Qty: 9 | Fill #0

## 2019-07-29 MED FILL — HYDROCODON-APAP 5-325: 5-325 | 1 days supply | Qty: 5 | Fill #0

## 2019-07-29 MED FILL — DEXAMETHASONE 4 MG TABLET: 4 | 3 days supply | Qty: 9 | Fill #0

## 2019-07-29 MED FILL — DOXYCYCLINE HYCLATE 100 MG: 100 | 3 days supply | Qty: 6 | Fill #0

## 2019-07-29 MED FILL — IBUPROFEN 400 MG TABS: 400 | 5 days supply | Qty: 30 | Fill #0

## 2019-08-11 ENCOUNTER — Encounter: Payer: Self-pay | Admitting: Neurology

## 2019-08-11 NOTE — Progress Notes (Signed)
Virtual Visit via Video Note The purpose of this virtual visit is to provide medical care while limiting exposure to the novel coronavirus.    Consent was obtained for video visit:  Yes.   Answered questions that patient had about telehealth interaction:  Yes.   I discussed the limitations, risks, security and privacy concerns of performing an evaluation and management service by telemedicine. I also discussed with the patient that there may be a patient responsible charge related to this service. The patient expressed understanding and agreed to proceed.  Pt location: Home Physician Location: office Name of referring provider:  Sharilyn Sites, MD I connected with Alexis Gomez at patients initiation/request on 08/12/2019 at  9:50 AM EDT by video enabled telemedicine application and verified that I am speaking with the correct person using two identifiers. Pt MRN:  [id number] Pt DOB:  08-17-1985 Video Participants:  Alexis Gomez   History of Present Illness:  Alexis Gomez is a 34 year old female who follows up for papilledema.  UPDATE:  Acetazolamide was discontinued in January.  She had a repeat eye exam with Dr. Gershon Crane on 07/02/2019 which again demonstrated no recurrence of papilledema.  HISTORY: In August 2020, she began experiencing double vision, described as vertical,occurs whether she is walking or sitting and watching TV. Symptoms are intermittent. They occur daily, lasting a minute or two off and on throughout the day.No visual obscurations but sometimes floaters. She notes hearing whooshing sounds in her ears. About one week prior to onset of double vision, she had a headache, moderate to severe bifrontal/temporal associated with nausea and vomiting. It subsequently resolved once the double vision started. She denies history of headaches.  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 She was evaluated by ophthalmologist, Dr. Rutherford Guys, on 12/02/18, who noted bilateral myopia, but also exam demonstrated bilateral papilledema. Visual fields were full.  Because she endorsed double vision, myasthenia panel was checked on 12/16/2018, which was negative. 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 Lumbar puncture from 01/12/2019 demonstrated elevated opening pressure of 29.5 cm H2O.  CSF analysis was normal (including cellc ount, protein, glucose and culture).  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  She was started on acetazolamide 500mg  twice daily on 01/13/2019.  She had a repeat eye exam by Dr. Gershon Crane on 03/02/2019 which demonstrated resolution of papilledema.  Patient also endorsed resolution of diplopia.  Acetazolamide was decreased to 250mg  twice daily.  Repeat eye exam with Dr. Gershon Crane again demonstrated no papilledema.  Acetazolamide was discontinued in January.  No significant weight gain. She was taking Lo Loestrin Fe which was subsequently discontinued.  Past Medical History: Past Medical History:  Diagnosis Date  . Boil of buttock 04/28/2015  . History of abnormal cervical Pap smear 05/19/2013   Has had laser cone in past  . Vaginal Pap smear, abnormal     Medications: Outpatient Encounter Medications as of 08/12/2019  Medication Sig  . acetaZOLAMIDE (DIAMOX) 250 MG tablet Take 1 tablet (250 mg total) by mouth 2 (two) times daily.  . diphenhydrAMINE (BENADRYL) 50 MG capsule Take 50 mg by mouth every 6 (six) hours as needed.  . lidocaine (LIDODERM) 5 % Place 1 patch onto the  skin daily. Remove & Discard patch within 12 hours or as directed by MD  . LO LOESTRIN FE 1 MG-10 MCG / 10 MCG tablet Take 1 tablet by mouth once daily  . loratadine (CLARITIN)  10 MG tablet Take 10 mg by mouth as needed for allergies.  . methocarbamol (ROBAXIN) 500 MG tablet Take 1 tablet (500 mg total) by mouth 2 (two) times daily.  . naproxen (NAPROSYN) 500 MG tablet Take 1 tablet (500 mg total) by mouth 2 (two) times daily.  . ondansetron (ZOFRAN) 4 MG tablet Take 4 mg by mouth every 6 (six) hours as needed. for nausea  . Phenylephrine-APAP-guaiFENesin (TYLENOL SINUS SEVERE PO) Take by mouth.   No facility-administered encounter medications on file as of 08/12/2019.    Allergies: No Known Allergies  Family History: Family History  Problem Relation Age of Onset  . Seizures Father   . Heart disease Maternal Grandmother   . Diabetes Maternal Grandmother   . Stroke Maternal Grandmother   . Hypertension Maternal Grandmother   . Heart disease Maternal Grandfather   . Diabetes Maternal Grandfather   . Stroke Maternal Grandfather   . Heart disease Paternal Grandmother   . Diabetes Paternal Grandmother   . Heart disease Paternal Grandfather   . Diabetes Paternal Grandfather     Social History: Social History   Socioeconomic History  . Marital status: Single    Spouse name: Not on file  . Number of children: Not on file  . Years of education: Not on file  . Highest education level: Not on file  Occupational History  . Not on file  Tobacco Use  . Smoking status: Current Every Day Smoker    Packs/day: 1.00    Years: 10.00    Pack years: 10.00    Types: Cigarettes, E-cigarettes  . Smokeless tobacco: Never Used  . Tobacco comment: less than 1PPD  Substance and Sexual Activity  . Alcohol use: Yes    Comment: rarely  . Drug use: No  . Sexual activity: Not Currently    Birth control/protection: Pill  Other Topics Concern  . 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 Reino Kent daily; Exercise - not now   Social Determinants of Health   Financial  Resource Strain:   . Difficulty of Paying Living Expenses:   Food Insecurity:   . Worried About Programme researcher, broadcasting/film/video in the Last Year:   . Barista in the Last Year:   Transportation Needs:   . Freight forwarder (Medical):   Marland Kitchen Lack of Transportation (Non-Medical):   Physical Activity:   . Days of Exercise per Week:   . Minutes of Exercise per Session:   Stress:   . Feeling of Stress :   Social Connections:   . Frequency of Communication with Friends and Family:   . Frequency of Social Gatherings with Friends and Family:   . Attends Religious Services:   . Active Member of Clubs or Organizations:   . Attends Banker Meetings:   Marland Kitchen Marital Status:   Intimate Partner Violence:   . Fear of Current or Ex-Partner:   . Emotionally Abused:   Marland Kitchen Physically Abused:   . Sexually Abused:     Observations/Objective:   Height 5\' 3"  (1.6 m), weight 180 lb (81.6 kg). No acute distress.  Alert and oriented.  Speech fluent and  not dysarthric.  Language intact.  Eyes orthophoric on primary gaze.  Face symmetric.  Assessment and Plan:   Idiopathic intracranial hypertension.  Off acetazolamide and without recurrence of papilledema.  1.  Will remain off of acetazolamide and continue to monitor with routine eye exams.  She has follow up appointment with Dr. Nile Riggs in August.  If she experiences clinical symptoms such as new headache, visual disturbance or pulsatile tinnitus, she should make sooner appointment with Dr. Nile Riggs for re-evaluation and follow up with me.  If there is recurrence of papilledema, she will contact me to restart acetazolamide.    Follow Up Instructions:    -I discussed the assessment and treatment plan with the patient. The patient was provided an opportunity to ask questions and all were answered. The patient agreed with the plan and demonstrated an understanding of the instructions.   The patient was advised to call back or seek an in-person  evaluation if the symptoms worsen or if the condition fails to improve as anticipated.     Cira Servant, DO

## 2019-08-12 ENCOUNTER — Telehealth (INDEPENDENT_AMBULATORY_CARE_PROVIDER_SITE_OTHER): Payer: No Typology Code available for payment source | Admitting: Neurology

## 2019-08-12 ENCOUNTER — Other Ambulatory Visit: Payer: Self-pay

## 2019-08-12 ENCOUNTER — Encounter: Payer: Self-pay | Admitting: Neurology

## 2019-08-12 VITALS — Ht 63.0 in | Wt 180.0 lb

## 2019-08-12 DIAGNOSIS — G932 Benign intracranial hypertension: Secondary | ICD-10-CM | POA: Diagnosis not present

## 2019-10-08 NOTE — Progress Notes (Signed)
Office Visit Note  Patient: Alexis Gomez             Date of Birth: 1985-07-22           MRN: [id number]             PCP: Sharilyn Sites, MD Referring: [employer], MD Visit Date: 10/09/2019 Occupation: _0 @  Subjective:  Left ankle pain and swelling   History of Present Illness: Alexis Gomez is a 34 y.o. female seen in consultation per request of Dr. Gerarda Fraction. According the patient she developed a knot on her left foot about 2 months ago at the time she was seen by her PCP and was given intramuscular steroids and prednisone taper. She states not on her ankle and the swelling in her ankle improved. She states ankle was red and warm and this episode happened it was also quite swollen. A month later swelling in the not recurred. This time she did not take any medication. She states the swelling has gone down some but she still continues to have pain and discomfort in her left ankle joint. She developed headaches last August and was diagnosed with pseudotumor cerebri. She was on Diamox for a while and was followed by Dr. Gershon Crane. She states the Diamox was discontinued after the optic edema improved. She was taken off the oral contraceptive pills in August 2020. She is gravida 0. She is currently not sexually active. There is no family history of autoimmune disease.  Activities of Daily Living:  Patient reports morning stiffness for 15-20  minutes.   Patient Denies nocturnal pain.  Difficulty dressing/grooming: Denies Difficulty climbing stairs: Reports Difficulty getting out of chair: Denies Difficulty using hands for taps, buttons, cutlery, and/or writing: Denies  Review of Systems  Constitutional: Negative for fatigue, night sweats, weight gain and weight loss.  HENT: Negative for mouth sores, trouble swallowing, trouble swallowing, mouth dryness and nose dryness.   Eyes: Negative for pain, redness, itching, visual disturbance and dryness.  Respiratory: Negative for cough,  shortness of breath and difficulty breathing.   Cardiovascular: Negative for chest pain, palpitations, hypertension, irregular heartbeat and swelling in legs/feet.  Gastrointestinal: Negative for blood in stool, constipation and diarrhea.  Endocrine: Negative for increased urination.  Genitourinary: Negative for difficulty urinating, painful urination and vaginal dryness.  Musculoskeletal: Positive for arthralgias, joint pain, joint swelling and morning stiffness. Negative for myalgias, muscle weakness, muscle tenderness and myalgias.  Skin: Negative for color change, rash, hair loss, redness, skin tightness, ulcers and sensitivity to sunlight.  Allergic/Immunologic: Negative for susceptible to infections.  Neurological: Negative for dizziness, numbness, headaches, memory loss, night sweats and weakness.  Hematological: Negative for bruising/bleeding tendency and swollen glands.  Psychiatric/Behavioral: Negative for depressed mood, confusion and sleep disturbance. The patient is not nervous/anxious.     PMFS History:  Patient Active Problem List   Diagnosis Date Noted   Boil of buttock 04/28/2015   Contraceptive management 05/19/2013   History of abnormal cervical Pap smear 05/19/2013    Past Medical History:  Diagnosis Date   Boil of buttock 04/28/2015   History of abnormal cervical Pap smear 05/19/2013   Has had laser cone in past   Vaginal Pap smear, abnormal     Family History  Problem Relation Age of Onset   Seizures Father    Heart disease Maternal Grandmother    Diabetes Maternal Grandmother    Stroke Maternal Grandmother    Hypertension Maternal Grandmother    Heart disease Maternal  Grandfather    Diabetes Maternal Grandfather    Stroke Maternal Grandfather    Heart disease Paternal Grandmother    Diabetes Paternal Grandmother    Heart disease Paternal Grandfather    Diabetes Paternal Grandfather    Past Surgical History:  Procedure Laterality Date     COLPOSCOPY     laser cone     MOUTH SURGERY     x2   Social History   Social History Narrative   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 Malachi Bonds daily; Exercise - not now   Immunization History  Administered Date(s) Administered   Influenza-Unspecified 01/16/2013     Objective: Vital Signs: BP 133/89 (BP Location: Right Arm, Patient Position: Sitting, Cuff Size: Normal)    Pulse 93    Resp 15    Ht 5' 3" (1.6 m)    Wt 172 lb 6.4 oz (78.2 kg)    BMI 30.54 kg/m    Physical Exam Vitals and nursing note reviewed.  Constitutional:      Appearance: She is well-developed.  HENT:     Head: Normocephalic and atraumatic.  Eyes:     Comments: Right conjunctival injection was noted.  Cardiovascular:     Rate and Rhythm: Normal rate and regular rhythm.     Heart sounds: Normal heart sounds.  Pulmonary:     Effort: Pulmonary effort is normal.     Breath sounds: Normal breath sounds.  Abdominal:     General: Bowel sounds are normal.     Palpations: Abdomen is soft.  Musculoskeletal:     Cervical back: Normal range of motion.  Lymphadenopathy:     Cervical: No cervical adenopathy.  Skin:    General: Skin is warm and dry.     Capillary Refill: Capillary refill takes less than 2 seconds.  Neurological:     Mental Status: She is alert and oriented to person, place, and time.  Psychiatric:        Behavior: Behavior normal.      Musculoskeletal Exam: C-spine thoracic and lumbar spine with good range of motion. She had no SI joint tenderness. Shoulder joints, elbow joints, wrist joints, MCPs PIPs and DIPs with good range of motion with no synovitis. Hip joints, knee joints, ankles, MTPs and PIPs with good range of motion. She had warmth and swelling over her left ankle anterior aspect. A palpable nodule was noted over the anterior aspect of her ankle.  CDAI Exam: CDAI Score: -- Patient Global: --; Provider Global:  -- Swollen: --; Tender: -- Joint Exam 10/09/2019   No joint exam has been documented for this visit   There is currently no information documented on the homunculus. Go to the Rheumatology activity and complete the homunculus joint exam.  Investigation: No additional findings.  Imaging: XR Ankle Complete Left  Result Date: 10/09/2019 No tibiotalar and subtalar joint space narrowing was noted. No intertarsal joint space narrowing was noted. Soft tissue swelling was noted over the anterior aspect of the ankle. Inferior calcaneal spur was noted. No erosive changes were noted. Impression: These findings are consistent with soft tissue swelling of the ankle.   Recent Labs: Lab Results  Component Value Date   WBC 6.7 11/22/2009   HGB 14.2 11/22/2009   PLT 184 11/22/2009   NA 137 11/22/2009   K 3.5 11/22/2009   CL 106 11/22/2009   CO2 26 11/22/2009   GLUCOSE 109 (H) 11/22/2009  BUN 4 (L) 11/22/2009   CREATININE 0.72 11/22/2009   BILITOT 0.5 11/22/2009   ALKPHOS 47 11/22/2009   AST 22 11/22/2009   ALT 11 11/22/2009   PROT 7.8 11/22/2009   ALBUMIN 4.1 11/22/2009   CALCIUM 9.2 11/22/2009   GFRAA  11/22/2009    >60        The eGFR has been calculated using the MDRD equation. This calculation has not been validated in all clinical situations. eGFR's persistently <60 mL/min signify possible Chronic Kidney Disease.    Speciality Comments: No specialty comments available.  Procedures:  No procedures performed Allergies: Patient has no known allergies.   Assessment / Plan:     Visit Diagnoses: Pain in left ankle and joints of left foot -patient has been having persistent swelling in her left ankle joint. She also has a palpable nodule over the anterior aspect of her ankle. X-ray obtained today was unremarkable. I will also obtain some autoimmune labs. I will schedule MRI of her ankle joint to evaluate this further. She has been having persistent issues despite having  intramuscular steroids and prednisone taper. She has a palpable nodule over the anterior aspect of her left ankle. 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 She never had discoloration in the nodule which is the typical course of erythema nodosum. She also did not develop any other nodules. Plan: MR ANKLE LEFT WO CONTRAST, XR Ankle Complete Left, Sedimentation rate, Rheumatoid factor, Cyclic citrul peptide antibody, IgG, ANA, HLA-B27 antigen, Angiotensin converting enzyme, Pan-ANCA  Left ankle swelling - Plan: MR ANKLE LEFT WO CONTRAST, XR Ankle Complete Left  High risk medication use - Plan: CBC with Differential/Platelet, COMPLETE METABOLIC PANEL WITH GFR, Hepatitis B core antibody, IgM, Hepatitis B surface antigen, Hepatitis C antibody, QuantiFERON-TB Gold Plus, Serum protein electrophoresis with reflex, HIV Antibody (routine testing w rflx), IgG, IgA, IgM, DG Chest 2 View  Redness of right eye-right eye conjunctival erythema was noted. She sees Dr. Gershon Crane. Have advised her to schedule an appointment to rule out any autoimmune process.  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 She has had plantar fasciitis in the past. She has mild symptoms but does not limit her activity.  Pseudotumor cerebri-patient was diagnosed with pseudotumor cerebri in August 2020. At the time she was taken off the oral contraceptive pills. She was treated with Diamox. Diamox was discontinued as her eye symptoms improved.  Smoker - 1PPDx15 years. decreased to 3 cigs per day. She is trying smoking cessation.- Plan: DG Chest 2 View  History of abnormal cervical Pap smear  Orders: Orders Placed This Encounter  Procedures   MR ANKLE LEFT WO CONTRAST   XR Ankle Complete Left   DG Chest 2 View   CBC with Differential/Platelet   COMPLETE METABOLIC PANEL WITH GFR   Sedimentation rate   Rheumatoid factor   Cyclic citrul peptide antibody, IgG   ANA    HLA-B27 antigen   Angiotensin converting enzyme   Pan-ANCA   Hepatitis B core antibody, IgM   Hepatitis B surface antigen   Hepatitis C antibody   QuantiFERON-TB Gold Plus   Serum protein electrophoresis with reflex   HIV Antibody (routine testing w rflx)   IgG, IgA, IgM   No orders of the defined types were placed in this encounter.     Follow-Up Instructions: Return for Left ankle swelling.   Bo Merino, MD  Note - This record has been created using Editor, commissioning.  Chart creation errors have been sought, but may not always  have been located. Such creation errors do not reflect on  the standard of medical care.

## 2019-10-09 ENCOUNTER — Ambulatory Visit (HOSPITAL_COMMUNITY)
Admission: RE | Admit: 2019-10-09 | Discharge: 2019-10-09 | Disposition: A | Discharge status: Home / Self Care | Payer: No Typology Code available for payment source | Source: Ambulatory Visit | Attending: Rheumatology | Admitting: Rheumatology

## 2019-10-09 ENCOUNTER — Other Ambulatory Visit: Payer: Self-pay

## 2019-10-09 ENCOUNTER — Ambulatory Visit: Payer: No Typology Code available for payment source | Admitting: Rheumatology

## 2019-10-09 ENCOUNTER — Encounter: Payer: Self-pay | Admitting: Rheumatology

## 2019-10-09 ENCOUNTER — Ambulatory Visit: Payer: Self-pay

## 2019-10-09 VITALS — BP 133/89 | HR 93 | Resp 15 | Ht 63.0 in | Wt 172.4 lb

## 2019-10-09 DIAGNOSIS — Z8742 Personal history of other diseases of the female genital tract: Secondary | ICD-10-CM

## 2019-10-09 DIAGNOSIS — F17200 Nicotine dependence, unspecified, uncomplicated: Secondary | ICD-10-CM

## 2019-10-09 DIAGNOSIS — M25472 Effusion, left ankle: Secondary | ICD-10-CM | POA: Diagnosis not present

## 2019-10-09 DIAGNOSIS — Z79899 Other long term (current) drug therapy: Secondary | ICD-10-CM | POA: Diagnosis not present

## 2019-10-09 DIAGNOSIS — M25572 Pain in left ankle and joints of left foot: Secondary | ICD-10-CM

## 2019-10-09 DIAGNOSIS — M722 Plantar fascial fibromatosis: Secondary | ICD-10-CM

## 2019-10-09 DIAGNOSIS — H5789 Other specified disorders of eye and adnexa: Secondary | ICD-10-CM | POA: Diagnosis not present

## 2019-10-09 DIAGNOSIS — G932 Benign intracranial hypertension: Secondary | ICD-10-CM

## 2019-10-11 NOTE — Progress Notes (Signed)
Chest xray is normal

## 2019-10-15 LAB — CBC WITH DIFFERENTIAL/PLATELET
Absolute Monocytes: 527 cells/uL (ref 200–950)
Basophils Absolute: 32 cells/uL (ref 0–200)
Basophils Relative: 0.4 %
Eosinophils Absolute: 89 cells/uL (ref 15–500)
Eosinophils Relative: 1.1 %
HCT: 40.4 % (ref 35.0–45.0)
Hemoglobin: 13.6 g/dL (ref 11.7–15.5)
Lymphs Abs: 2195 cells/uL (ref 850–3900)
MCH: 31.1 pg (ref 27.0–33.0)
MCHC: 33.7 g/dL (ref 32.0–36.0)
MCV: 92.2 fL (ref 80.0–100.0)
MPV: 9.9 fL (ref 7.5–12.5)
Monocytes Relative: 6.5 %
Neutro Abs: 5257 cells/uL (ref 1500–7800)
Neutrophils Relative %: 64.9 %
Platelets: 230 10*3/uL (ref 140–400)
RBC: 4.38 10*6/uL (ref 3.80–5.10)
RDW: 12 % (ref 11.0–15.0)
Total Lymphocyte: 27.1 %
WBC: 8.1 10*3/uL (ref 3.8–10.8)

## 2019-10-15 LAB — HEPATITIS B SURFACE ANTIGEN: Hepatitis B Surface Ag: NONREACTIVE

## 2019-10-15 LAB — IGG, IGA, IGM
IgG (Immunoglobin G), Serum: 1057 mg/dL (ref 600–1640)
IgM, Serum: 278 mg/dL (ref 50–300)
Immunoglobulin A: 549 mg/dL — ABNORMAL HIGH (ref 47–310)

## 2019-10-15 LAB — COMPLETE METABOLIC PANEL WITH GFR
AG Ratio: 1.5 (calc) (ref 1.0–2.5)
ALT: 16 U/L (ref 6–29)
AST: 36 U/L — ABNORMAL HIGH (ref 10–30)
Albumin: 4.5 g/dL (ref 3.6–5.1)
Alkaline phosphatase (APISO): 77 U/L (ref 31–125)
BUN: 8 mg/dL (ref 7–25)
CO2: 28 mmol/L (ref 20–32)
Calcium: 9.3 mg/dL (ref 8.6–10.2)
Chloride: 103 mmol/L (ref 98–110)
Creat: 0.55 mg/dL (ref 0.50–1.10)
GFR, Est African American: 145 mL/min/{1.73_m2} (ref >=60)
GFR, Est Non African American: 125 mL/min/{1.73_m2} (ref >=60)
Globulin: 3 g/dL (calc) (ref 1.9–3.7)
Glucose, Bld: 105 mg/dL — ABNORMAL HIGH (ref 65–99)
Potassium: 3.6 mmol/L (ref 3.5–5.3)
Sodium: 138 mmol/L (ref 135–146)
Total Bilirubin: 0.3 mg/dL (ref 0.2–1.2)
Total Protein: 7.5 g/dL (ref 6.1–8.1)

## 2019-10-15 LAB — PROTEIN ELECTROPHORESIS, SERUM, WITH REFLEX
Albumin ELP: 4.1 g/dL (ref 3.8–4.8)
Alpha 1: 0.3 g/dL (ref 0.2–0.3)
Alpha 2: 0.8 g/dL (ref 0.5–0.9)
Beta 2: 0.6 g/dL — ABNORMAL HIGH (ref 0.2–0.5)
Beta Globulin: 0.5 g/dL (ref 0.4–0.6)
Gamma Globulin: 1.1 g/dL (ref 0.8–1.7)
Total Protein: 7.4 g/dL (ref 6.1–8.1)

## 2019-10-15 LAB — HLA-B27 ANTIGEN: HLA-B27 Antigen: NEGATIVE

## 2019-10-15 LAB — CYCLIC CITRUL PEPTIDE ANTIBODY, IGG: Cyclic Citrullin Peptide Ab: <16 UNITS

## 2019-10-15 LAB — HEPATITIS C ANTIBODY
Hepatitis C Ab: NONREACTIVE
SIGNAL TO CUT-OFF: 0 (ref <1.00)

## 2019-10-15 LAB — PAN-ANCA
ANCA Screen: NEGATIVE
Myeloperoxidase Abs: <1 AI
Serine Protease 3: <1 AI

## 2019-10-15 LAB — HEPATITIS B CORE ANTIBODY, IGM: Hep B C IgM: NONREACTIVE

## 2019-10-15 LAB — ANA: Anti Nuclear Antibody (ANA): POSITIVE — AB

## 2019-10-15 LAB — QUANTIFERON-TB GOLD PLUS
Mitogen-NIL: >10 IU/mL
NIL: 0.02 IU/mL
QuantiFERON-TB Gold Plus: NEGATIVE
TB1-NIL: 0.05 IU/mL
TB2-NIL: 0.06 IU/mL

## 2019-10-15 LAB — RHEUMATOID FACTOR: Rheumatoid fact SerPl-aCnc: <14 IU/mL (ref <14)

## 2019-10-15 LAB — ANTI-NUCLEAR AB-TITER (ANA TITER): ANA Titer 1: 1:320 {titer} — ABNORMAL HIGH

## 2019-10-15 LAB — HIV ANTIBODY (ROUTINE TESTING W REFLEX): HIV 1&2 Ab, 4th Generation: NONREACTIVE

## 2019-10-15 LAB — SEDIMENTATION RATE: Sed Rate: 34 mm/h — ABNORMAL HIGH (ref 0–20)

## 2019-10-15 LAB — ANGIOTENSIN CONVERTING ENZYME: Angiotensin-Converting Enzyme: 14 U/L (ref 9–67)

## 2019-10-15 LAB — IFE INTERPRETATION: Immunofix Electr Int: NOT DETECTED

## 2019-10-24 ENCOUNTER — Ambulatory Visit (HOSPITAL_COMMUNITY)
Admission: RE | Admit: 2019-10-24 | Discharge: 2019-10-24 | Disposition: A | Discharge status: Home / Self Care | Payer: No Typology Code available for payment source | Source: Ambulatory Visit | Attending: Rheumatology | Admitting: Rheumatology

## 2019-10-24 ENCOUNTER — Other Ambulatory Visit: Payer: Self-pay

## 2019-10-24 DIAGNOSIS — M25572 Pain in left ankle and joints of left foot: Secondary | ICD-10-CM | POA: Diagnosis present

## 2019-10-24 DIAGNOSIS — M25472 Effusion, left ankle: Secondary | ICD-10-CM | POA: Insufficient documentation

## 2019-10-24 NOTE — Progress Notes (Signed)
Office Visit Note  Patient: Alexis Gomez             Date of Birth: 1986-02-15           MRN: [id number]             PCP: Sharilyn Sites, MD Referring: Sharilyn Sites, MD Visit Date: 10/27/2019 Occupation: _0 @  Subjective:  Left ankle and left hip pain   History of Present Illness: Alexis Gomez is a 34 y.o. female with history of left ankle pain.  She states she continues to have pain and discomfort in her left ankle.  She states recently she has been experiencing pain in her left hip.  She describes that she sleeps on her side almost on her abdomen with a stretched arm and her left leg is stretched.  None of the other joints are painful.  Activities of Daily Living:  Patient reports morning stiffness for 15 minutes.   Patient Denies nocturnal pain.  Difficulty dressing/grooming: Denies Difficulty climbing stairs: Reports Difficulty getting out of chair: Denies Difficulty using hands for taps, buttons, cutlery, and/or writing: Denies  Review of Systems  Constitutional: Negative for fatigue, night sweats, weight gain and weight loss.  HENT: Negative for mouth sores, trouble swallowing, trouble swallowing, mouth dryness and nose dryness.   Eyes: Negative for pain, redness, visual disturbance and dryness.  Respiratory: Negative for cough, shortness of breath and difficulty breathing.   Cardiovascular: Negative for chest pain, palpitations, hypertension, irregular heartbeat and swelling in legs/feet.  Gastrointestinal: Negative for blood in stool, constipation and diarrhea.  Endocrine: Negative for increased urination.  Genitourinary: Negative for vaginal dryness.  Musculoskeletal: Positive for joint swelling and morning stiffness. Negative for arthralgias, joint pain, myalgias, muscle weakness, muscle tenderness and myalgias.  Skin: Negative for color change, rash, hair loss, skin tightness, ulcers and sensitivity to sunlight.  Allergic/Immunologic: Negative for susceptible  to infections.  Neurological: Negative for dizziness, memory loss, night sweats and weakness.  Hematological: Negative for swollen glands.  Psychiatric/Behavioral: Negative for depressed mood and sleep disturbance. The patient is not nervous/anxious.     PMFS History:  Patient Active Problem List   Diagnosis Date Noted  . Boil of buttock 04/28/2015  . Contraceptive management 05/19/2013  . History of abnormal cervical Pap smear 05/19/2013    Past Medical History:  Diagnosis Date  . Boil of buttock 04/28/2015  . History of abnormal cervical Pap smear 05/19/2013   Has had laser cone in past  . Vaginal Pap smear, abnormal     Family History  Problem Relation Age of Onset  . Seizures Father   . Heart disease Maternal Grandmother   . Diabetes Maternal Grandmother   . Stroke Maternal Grandmother   . Hypertension Maternal Grandmother   . Heart disease Maternal Grandfather   . Diabetes Maternal Grandfather   . Stroke Maternal Grandfather   . Heart disease Paternal Grandmother   . Diabetes Paternal Grandmother   . Heart disease Paternal Grandfather   . Diabetes Paternal Grandfather    Past Surgical History:  Procedure Laterality Date  . COLPOSCOPY    . laser cone    . 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 Malachi Bonds daily; Exercise - not now   Immunization History  Administered Date(s) Administered  . Influenza-Unspecified 01/16/2013     Objective:  Vital Signs: BP 117/83 (BP Location: Left Arm, Patient Position: Sitting, Cuff Size: Small)   Pulse 87   Resp 12   Ht _0  (1.6 m)   Wt 176 lb 6.4 oz (80 kg)   LMP 10/06/2019   BMI 31.25 kg/m    Physical Exam Vitals and nursing note reviewed.  Constitutional:      Appearance: She is well-developed.  HENT:     Head: Normocephalic and atraumatic.  Eyes:     Conjunctiva/sclera: Conjunctivae  normal.  Cardiovascular:     Rate and Rhythm: Normal rate and regular rhythm.     Heart sounds: Normal heart sounds.  Pulmonary:     Effort: Pulmonary effort is normal.     Breath sounds: Normal breath sounds.  Abdominal:     General: Bowel sounds are normal.     Palpations: Abdomen is soft.  Musculoskeletal:     Cervical back: Normal range of motion.  Lymphadenopathy:     Cervical: No cervical adenopathy.  Skin:    General: Skin is warm and dry.     Capillary Refill: Capillary refill takes less than 2 seconds.  Neurological:     Mental Status: She is alert and oriented to person, place, and time.  Psychiatric:        Behavior: Behavior normal.      Musculoskeletal Exam: C-spine thoracic and lumbar spine with good range of motion.  Shoulder joints, elbow joints, wrist joints, MCPs PIPs and DIPs with good range of motion with no synovitis.  Hip joints, knee joints, ankles, MTPs with good range of motion.  She had tenderness on palpation over left tibialis anterior tendon.  She also had tenderness over left trochanteric bursa.  CDAI Exam: CDAI Score: -- Patient Global: --; Provider Global: -- Swollen: --; Tender: -- Joint Exam 10/27/2019   No joint exam has been documented for this visit   There is currently no information documented on the homunculus. Go to the Rheumatology activity and complete the homunculus joint exam.  Investigation: No additional findings.  Imaging: DG Chest 2 View  Result Date: 10/10/2019 CLINICAL DATA:  Cigarette smoker.  Cough. EXAM: CHEST - 2 VIEW COMPARISON:  None. FINDINGS: The lungs are clear. Heart size is normal. No pneumothorax or pleural fluid. No acute or focal bony abnormality. IMPRESSION: No acute disease. Electronically Signed   By: Inge Rise M.D.   On: 10/10/2019 15:51   MR ANKLE LEFT WO CONTRAST  Result Date: 10/26/2019 CLINICAL DATA:  Palpable erythematous knot in the left ankle 2 months ago, subsequently improved. Persistent  ankle pain and discomfort. EXAM: MRI OF THE LEFT ANKLE WITHOUT CONTRAST TECHNIQUE: Multiplanar, multisequence MR imaging of the ankle was performed. No intravenous contrast was administered. COMPARISON:  Radiographs 10/09/2019 FINDINGS: TENDONS Peroneal: Intact and normally positioned. Posteromedial: Intact and normally positioned. A small amount of fluid in the posterior tibialis tendon sheath is within physiologic limits. Anterior: A capsule was placed anteriorly over the palpable concern. This lies just medial to the tibialis anterior tendon which demonstrates a small amount of sheath fluid and surrounding soft tissue edema. There is no tendon tear. The additional anterior extensor tendons appear normal. Achilles: Intact. Plantar Fascia: Intact. LIGAMENTS Lateral: The anterior and posterior talofibular and calcaneofibular ligaments are intact. Medial: The deltoid and visualized portions of the spring ligament appear intact. CARTILAGE AND BONES Ankle Joint: No significant ankle joint effusion. The talar dome and tibial plafond are intact. Subtalar Joints/Sinus Tarsi: Unremarkable. Bones: No significant extra-articular osseous  findings. Other: As above, there is anterior subcutaneous edema which extends medially along the distal tibia. There is a small amount of fluid within the anterior tibialis tendon sheath which may indicate tenosynovitis. No focal fluid collection, mass or foreign body identified. IMPRESSION: 1. Anterior subcutaneous edema which extends medially along the distal tibia. No focal fluid collection, mass or foreign body identified. This could indicate cellulitis. 2. Small amount of fluid within the anterior tibialis tendon sheath which may indicate tenosynovitis and contribute to the patient's symptoms. 3. The additional ankle tendons and ligaments appear normal. 4. No acute osseous findings or significant arthropathic changes. Electronically Signed   By: Richardean Sale M.D.   On: 10/26/2019  12:53   XR Ankle Complete Left  Result Date: 10/09/2019 No tibiotalar and subtalar joint space narrowing was noted. No intertarsal joint space narrowing was noted. Soft tissue swelling was noted over the anterior aspect of the ankle. Inferior calcaneal spur was noted. No erosive changes were noted. Impression: These findings are consistent with soft tissue swelling of the ankle.   Recent Labs: Lab Results  Component Value Date   WBC 8.1 10/09/2019   HGB 13.6 10/09/2019   PLT 230 10/09/2019   NA 138 10/09/2019   K 3.6 10/09/2019   CL 103 10/09/2019   CO2 28 10/09/2019   GLUCOSE 105 (H) 10/09/2019   BUN 8 10/09/2019   CREATININE 0.55 10/09/2019   BILITOT 0.3 10/09/2019   ALKPHOS 47 11/22/2009   AST 36 (H) 10/09/2019   ALT 16 10/09/2019   PROT 7.5 10/09/2019   PROT 7.4 10/09/2019   ALBUMIN 4.1 11/22/2009   CALCIUM 9.3 10/09/2019   GFRAA 145 10/09/2019   QFTBGOLDPLUS NEGATIVE 10/09/2019  October 09, 2018 IFE negative, immunoglobulins normal except IgA 549 elevated, TB gold negative, hepatitis B negative, hepatitis C negative, HIV negative, ESR 34, ANA 1: 320NS, RF negative, anti-CCP negative, HLA-B27 negative, ACE14, ANCA negative, MPO negative, serum proteinase 3 -  Speciality Comments: No specialty comments available.  Procedures:  Large Joint Inj: L greater trochanter on 10/27/2019 1:26 PM Indications: pain Details: 27 G 1.5 in needle, lateral approach  Arthrogram: No  Medications: 40 mg triamcinolone acetonide 40 MG/ML; 1.5 mL lidocaine 1 % Aspirate: 0 mL Outcome: tolerated well, no immediate complications Procedure, treatment alternatives, risks and benefits explained, specific risks discussed. Consent was given by the patient. Immediately prior to procedure a time out was called to verify the correct patient, procedure, equipment, support staff and site/side marked as required. Patient was prepped and draped in the usual sterile fashion.     Allergies: Patient has no known  allergies.   Assessment / Plan:     Visit Diagnoses: Pain in left ankle and joints of left foot - Persistent swelling and pain in the left ankle despite cortisone shot and prednisone use.    Left ankle swelling -I detailed discussion with patient regarding her labs.  She has positive ANA and all other autoimmune work-up was negative.  I will obtain ENA panel and complements today.  Although she does not have any other clinical features of autoimmune disease.  She also had MRI done which was reviewed.  The findings were consistent with tibialis anterior tendon tendinitis.  She will schedule appointment with Dr. Milinda Pointer for injection.  We will send a referral.  I am uncertain if this issue is coming from her sleeping position.  Modifying her sleeping position was discussed.  Trochanteric bursitis, left hip-she has been having increased pain and  discomfort.  It may be precipitated due to abnormal gait.  She has been limping due to left ankle pain.  After informed consent was obtained and different treatment options were discussed left trochanteric bursa was injected with cortisone as described above.  She tolerated the procedure well.  Some of the IT band stretching exercises were demonstrated and a handout was given.  Positive ANA (antinuclear antibody) -she has no history of oral ulcers, nasal ulcers, malar rash, photosensitivity or Raynaud's phenomenon.  Plan: Anti-scleroderma antibody, RNP Antibody, Anti-Qu antibody, Sjogrens syndrome-A extractable nuclear antibody, Sjogrens syndrome-B extractable nuclear antibody, Anti-DNA antibody, double-stranded, C3 and C4, Beta-2 glycoprotein antibodies, Cardiolipin antibodies, IgG, IgM, IgA, Lupus Anticoagulant Eval w/Reflex  Plantar fasciitis, bilateral - In the past.  She has mild symptoms as she stands for long hours.  Pseudotumor cerebri  Smoker - 1PPDx15 years. decreased to 3 cigs per day. She is trying smoking cessation.  History of abnormal cervical  Pap smear  Orders: Orders Placed This Encounter  Procedures  . Large Joint Inj: L greater trochanter  . Anti-scleroderma antibody  . RNP Antibody  . Anti-Roseboom antibody  . Sjogrens syndrome-A extractable nuclear antibody  . Sjogrens syndrome-B extractable nuclear antibody  . Anti-DNA antibody, double-stranded  . C3 and C4  . Beta-2 glycoprotein antibodies  . Cardiolipin antibodies, IgG, IgM, IgA  . Lupus Anticoagulant Eval w/Reflex  . Ambulatory referral to Podiatry   No orders of the defined types were placed in this encounter.     Follow-Up Instructions: Return in about 3 months (around 01/27/2020) for Left ankle swelling.   Bo Merino, MD  Note - This record has been created using Editor, commissioning.  Chart creation errors have been sought, but may not always  have been located. Such creation errors do not reflect on  the standard of medical care.

## 2019-10-26 NOTE — Progress Notes (Signed)
I left a message for the patient on her answering machine to call back.  The MRI of the ankle showed tenosynovitis.  She may benefit from cortisone injection to the tendon.  We can refer her to orthopedics if she is in agreement.  I would also like for her to get some additional labs as her ANA is positive.  Please advise patient to get [facility] labs.

## 2019-10-27 ENCOUNTER — Other Ambulatory Visit: Payer: Self-pay

## 2019-10-27 ENCOUNTER — Encounter: Payer: Self-pay | Admitting: Rheumatology

## 2019-10-27 ENCOUNTER — Ambulatory Visit: Payer: No Typology Code available for payment source | Admitting: Rheumatology

## 2019-10-27 VITALS — BP 117/83 | HR 87 | Resp 12 | Ht 63.0 in | Wt 176.4 lb

## 2019-10-27 DIAGNOSIS — M722 Plantar fascial fibromatosis: Secondary | ICD-10-CM

## 2019-10-27 DIAGNOSIS — M25572 Pain in left ankle and joints of left foot: Secondary | ICD-10-CM | POA: Diagnosis not present

## 2019-10-27 DIAGNOSIS — R768 Other specified abnormal immunological findings in serum: Secondary | ICD-10-CM

## 2019-10-27 DIAGNOSIS — R7689 Other specified abnormal immunological findings in serum: Secondary | ICD-10-CM

## 2019-10-27 DIAGNOSIS — M7062 Trochanteric bursitis, left hip: Secondary | ICD-10-CM

## 2019-10-27 DIAGNOSIS — Z8742 Personal history of other diseases of the female genital tract: Secondary | ICD-10-CM

## 2019-10-27 DIAGNOSIS — M25472 Effusion, left ankle: Secondary | ICD-10-CM

## 2019-10-27 DIAGNOSIS — F17200 Nicotine dependence, unspecified, uncomplicated: Secondary | ICD-10-CM

## 2019-10-27 DIAGNOSIS — G932 Benign intracranial hypertension: Secondary | ICD-10-CM

## 2019-10-27 MED ORDER — TRIAMCINOLONE ACETONIDE 40 MG/ML IJ SUSP
40.0000 mg | INTRAMUSCULAR | Status: AC | PRN
  Administered 2019-10-27: 40 mg via INTRA_ARTICULAR

## 2019-10-27 MED ORDER — LIDOCAINE HCL 1 % IJ SOLN
1.5000 mL | INTRAMUSCULAR | Status: AC | PRN
  Administered 2019-10-27: 1.5 mL

## 2019-10-27 NOTE — Patient Instructions (Signed)
Iliotibial Band Syndrome Rehab Ask your health care provider which exercises are safe for you. Do exercises exactly as told by your health care provider and adjust them as directed. It is normal to feel mild stretching, pulling, tightness, or discomfort as you do these exercises. Stop right away if you feel sudden pain or your pain gets significantly worse. Do not begin these exercises until told by your health care provider. Stretching and range-of-motion exercises These exercises warm up your muscles and joints and improve the movement and flexibility of your hip and pelvis. Quadriceps stretch, prone  1. Lie on your abdomen on a firm surface, such as a bed or padded floor (prone position). 2. Bend your left / right knee and reach back to hold your ankle or pant leg. If you cannot reach your ankle or pant leg, loop a belt around your foot and grab the belt instead. 3. Gently pull your heel toward your buttocks. Your knee should not slide out to the side. You should feel a stretch in the front of your thigh and knee (quadriceps). 4. Hold this position for __________ seconds. Repeat __________ times. Complete this exercise __________ times a day. Iliotibial band stretch An iliotibial band is a strong band of muscle tissue that runs from the outer side of your hip to the outer side of your thigh and knee. 1. Lie on your side with your left / right leg in the top position. 2. Bend both of your knees and grab your left / right ankle. Stretch out your bottom arm to help you balance. 3. Slowly bring your top knee back so your thigh goes behind your trunk. 4. Slowly lower your top leg toward the floor until you feel a gentle stretch on the outside of your left / right hip and thigh. If you do not feel a stretch and your knee will not fall farther, place the heel of your other foot on top of your knee and pull your knee down toward the floor with your foot. 5. Hold this position for __________  seconds. Repeat __________ times. Complete this exercise __________ times a day. Strengthening exercises These exercises build strength and endurance in your hip and pelvis. Endurance is the ability to use your muscles for a long time, even after they get tired. Straight leg raises, side-lying This exercise strengthens the muscles that rotate the leg at the hip and move it away from your body (hip abductors). 1. Lie on your side with your left / right leg in the top position. Lie so your head, shoulder, hip, and knee line up. You may bend your bottom knee to help you balance. 2. Roll your hips slightly forward so your hips are stacked directly over each other and your left / right knee is facing forward. 3. Tense the muscles in your outer thigh and lift your top leg 4-6 inches (10-15 cm). 4. Hold this position for __________ seconds. 5. Slowly return to the starting position. Let your muscles relax completely before doing another repetition. Repeat __________ times. Complete this exercise __________ times a day. Leg raises, prone This exercise strengthens the muscles that move the hips (hip extensors). 1. Lie on your abdomen on your bed or a firm surface. You can put a pillow under your hips if that is more comfortable for your lower back. 2. Bend your left / right knee so your foot is straight up in the air. 3. Squeeze your buttocks muscles and lift your left / right thigh   off the bed. Do not let your back arch. 4. Tense your thigh muscle as hard as you can without increasing any knee pain. 5. Hold this position for __________ seconds. 6. Slowly lower your leg to the starting position and allow it to relax completely. Repeat __________ times. Complete this exercise __________ times a day. Hip hike 1. Stand sideways on a bottom step. Stand on your left / right leg with your other foot unsupported next to the step. You can hold on to the railing or wall for balance if needed. 2. Keep your knees  straight and your torso square. Then lift your left / right hip up toward the ceiling. 3. Slowly let your left / right hip lower toward the floor, past the starting position. Your foot should get closer to the floor. Do not lean or bend your knees. Repeat __________ times. Complete this exercise __________ times a day. This information is not intended to replace advice given to you by your health care provider. Make sure you discuss any questions you have with your health care provider. Document Revised: 07/17/2018 Document Reviewed: 01/15/2018 Elsevier Patient Education  2020 [employer]  

## 2019-10-29 LAB — ANTI-SMITH ANTIBODY: ENA SM Ab Ser-aCnc: <1 AI

## 2019-10-29 LAB — BETA-2 GLYCOPROTEIN ANTIBODIES
Beta-2 Glyco 1 IgA: <2 U/mL
Beta-2 Glyco 1 IgM: <2 U/mL
Beta-2 Glyco I IgG: <2 U/mL

## 2019-10-29 LAB — LUPUS ANTICOAGULANT EVAL W/ REFLEX
PTT-LA Screen: 35 s (ref <=40)
dRVVT: 45 s (ref <=45)

## 2019-10-29 LAB — CARDIOLIPIN ANTIBODIES, IGG, IGM, IGA
Anticardiolipin IgA: <2 APL-U/mL
Anticardiolipin IgG: <2 GPL-U/mL
Anticardiolipin IgM: <2 MPL-U/mL

## 2019-10-29 LAB — C3 AND C4
C3 Complement: 145 mg/dL (ref 83–193)
C4 Complement: 36 mg/dL (ref 15–57)

## 2019-10-29 LAB — SJOGRENS SYNDROME-B EXTRACTABLE NUCLEAR ANTIBODY: SSB (La) (ENA) Antibody, IgG: <1 AI

## 2019-10-29 LAB — RNP ANTIBODY: Ribonucleic Protein(ENA) Antibody, IgG: <1 AI

## 2019-10-29 LAB — SJOGRENS SYNDROME-A EXTRACTABLE NUCLEAR ANTIBODY: SSA (Ro) (ENA) Antibody, IgG: <1 AI

## 2019-10-29 LAB — ANTI-SCLERODERMA ANTIBODY: Scleroderma (Scl-70) (ENA) Antibody, IgG: <1 AI

## 2019-10-29 LAB — ANTI-DNA ANTIBODY, DOUBLE-STRANDED: ds DNA Ab: <1 IU/mL

## 2019-11-03 ENCOUNTER — Ambulatory Visit (INDEPENDENT_AMBULATORY_CARE_PROVIDER_SITE_OTHER): Payer: No Typology Code available for payment source | Admitting: Podiatry

## 2019-11-03 ENCOUNTER — Encounter: Payer: Self-pay | Admitting: Podiatry

## 2019-11-03 ENCOUNTER — Other Ambulatory Visit: Payer: Self-pay

## 2019-11-03 DIAGNOSIS — M76812 Anterior tibial syndrome, left leg: Secondary | ICD-10-CM | POA: Diagnosis not present

## 2019-11-04 NOTE — Progress Notes (Signed)
  Subjective:  Patient ID: Alexis Gomez, female    DOB: 03/31/1986,  MRN: [id number] HPI Chief Complaint  Patient presents with  . Ankle Pain    Anterior ankle left - had swelling and pain initially x 6 weeks ago, went to PCP-did bloodwork and Rx'd steroid-some better afterwards, but bloodwork was abnormal and referred to Rheumatology-they xrayed and did MRI-said tendonitis, so they referred here for injections and follow up   . New Patient (Initial Visit)    Est pt 2019    34 y.o. female presents with the above complaint.   ROS: Denies fever chills nausea vomiting muscle aches pains calf pain back pain chest pain shortness of breath.  Past Medical History:  Diagnosis Date  . Boil of buttock 04/28/2015  . History of abnormal cervical Pap smear 05/19/2013   Has had laser cone in past  . Vaginal Pap smear, abnormal    Past Surgical History:  Procedure Laterality Date  . COLPOSCOPY    . laser cone    . MOUTH SURGERY     x2   No current outpatient medications on file.  No Known Allergies Review of Systems Objective:  There were no vitals filed for this visit.  General: Well developed, nourished, in no acute distress, alert and oriented x3   Dermatological: Skin is warm, dry and supple bilateral. Nails x 10 are well maintained; remaining integument appears unremarkable at this time. There are no open sores, no preulcerative lesions, no rash or signs of infection present.  Vascular: Dorsalis Pedis artery and Posterior Tibial artery pedal pulses are 2/4 bilateral with immedate capillary fill time. Pedal hair growth present. No varicosities and no lower extremity edema present bilateral.   Neruologic: Grossly intact via light touch bilateral. Vibratory intact via tuning fork bilateral. Protective threshold with Semmes Wienstein monofilament intact to all pedal sites bilateral. Patellar and Achilles deep tendon reflexes 2+ bilateral. No Babinski or clonus noted bilateral.    Musculoskeletal: No gross boney pedal deformities bilateral. No pain, crepitus, or limitation noted with foot and ankle range of motion bilateral. Muscular strength 5/5 in all groups tested bilateral.  She has pain on palpation medial aspect of the tibialis anterior tendon there appears to be fluctuance in the area as well.  There is no soft tissue masses noted.  She has mild tenderness on dorsiflexion and inversion.  Gait: Unassisted, Nonantalgic.    Radiographs:  None taken but I did review previous MRI and x-rays.  MRI report does demonstrate tendinitis.  Assessment & Plan:   Assessment: Tibialis anterior tendinitis  Plan: Discussed etiology pathology conservative versus surgical therapies..  I injected 10 mg of Kenalog into the tendon sheath.  There is no complications with this.  Making sure not to inject into the tendon itself.  I placed her in a compression anklet and I like to follow-up with her in about 6 weeks.  We did discuss the use of ice therapy as well.     Milda Lindvall T. [place], [place]

## 2019-11-24 ENCOUNTER — Ambulatory Visit: Payer: No Typology Code available for payment source

## 2019-12-15 ENCOUNTER — Ambulatory Visit: Payer: No Typology Code available for payment source | Admitting: Podiatry

## 2020-01-05 ENCOUNTER — Ambulatory Visit: Payer: No Typology Code available for payment source | Admitting: Rheumatology

## 2020-01-14 ENCOUNTER — Ambulatory Visit (INDEPENDENT_AMBULATORY_CARE_PROVIDER_SITE_OTHER): Payer: No Typology Code available for payment source | Admitting: Adult Health

## 2020-01-14 ENCOUNTER — Other Ambulatory Visit (HOSPITAL_COMMUNITY)
Admission: RE | Admit: 2020-01-14 | Discharge: 2020-01-14 | Disposition: A | Discharge status: Home / Self Care | Payer: No Typology Code available for payment source | Source: Ambulatory Visit | Attending: Adult Health | Admitting: Adult Health

## 2020-01-14 ENCOUNTER — Encounter: Payer: Self-pay | Admitting: Adult Health

## 2020-01-14 ENCOUNTER — Telehealth: Payer: Self-pay | Admitting: *Deleted

## 2020-01-14 ENCOUNTER — Other Ambulatory Visit: Payer: Self-pay | Admitting: *Deleted

## 2020-01-14 ENCOUNTER — Other Ambulatory Visit: Payer: Self-pay

## 2020-01-14 VITALS — BP 129/81 | HR 92 | Ht 63.0 in | Wt 172.0 lb

## 2020-01-14 DIAGNOSIS — N926 Irregular menstruation, unspecified: Secondary | ICD-10-CM | POA: Diagnosis not present

## 2020-01-14 DIAGNOSIS — Z01419 Encounter for gynecological examination (general) (routine) without abnormal findings: Secondary | ICD-10-CM

## 2020-01-14 NOTE — Telephone Encounter (Signed)
Pt needs a 2 week [place] GYN( advised pt would be at [facility])

## 2020-01-14 NOTE — Progress Notes (Signed)
Patient ID: Alexis Gomez, female   DOB: 1986/03/07, 34 y.o.   MRN: [id number] History of Present Illness:  Alexis Gomez is a 34 year old white female,single, G0P0, in for well woman gyn exam and pap. She was told to stop COs, she had pseudotumor cerebri with intracranial hypertension,she had double vision that resolved. . 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 PCP is Dr Alexis Gomez    Current Medications, Allergies, Past Medical History, Past Surgical History, Family History and Social History were reviewed in [employer] electronic medical record.     Review of Systems: Patient denies any headaches, hearing loss, fatigue, blurred vision, shortness of breath, chest pain, abdominal pain, problems with bowel movements, urination, or intercourse.(not active) No  mood swings. Has hip and had negative work up  Has been bleeding on and off since August    Physical Exam:BP 129/81 (BP Location: Left Arm, Patient Position: Sitting, Cuff Size: Normal)   Pulse 92   Ht 5\' 3"  (1.6 m)   Wt 172 lb (78 kg)   LMP 11/24/2019 (Approximate)   BMI 30.47 kg/m  General:  Well developed, well nourished, no acute distress Skin:  Warm and dry Neck:  Midline trachea, normal thyroid, good ROM, no lymphadenopathy Lungs; Clear to auscultation bilaterally Breast:  No dominant palpable mass, retraction, or nipple discharge Cardiovascular: Regular rate and rhythm Abdomen:  Soft, non tender, no hepatosplenomegaly Pelvic:  External genitalia is normal in appearance, no lesions.  The vagina is normal in appearance. Urethra has no lesions or masses. The cervix is smooth, pap with high risk HPV genotyping performed.  Uterus is felt to be normal size, shape, and contour.  No adnexal masses or tenderness noted.Bladder is non tender, no masses felt. Extremities/musculoskeletal:  No swelling or varicosities noted, no clubbing or cyanosis Psych:  No mood changes, alert and cooperative,seems happy AA is 1 Fall risk is  low PHQ 9 score is 3, no SI   Upstream - 01/14/20 1143      Pregnancy Intention Screening   Does the patient want to become pregnant in the next year? No    Does the patient's partner want to become pregnant in the next year? N/A    Would the patient like to discuss contraceptive options today? No      Contraception Wrap Up   Current Method No Contraceptive Precautions    End Method No Contraception Precautions    Contraception Counseling Provided No         Examination chaperoned by 03/15/20 RN   Impression and Plan:   1. Encounter for gynecological examination with Papanicolaou smear of cervix Pap sent Physical in 1 year  Pap in 3 if normal Mammogram at 42 Labs with PCP   2. Irregular bleeding Return for GYN [facility] in about 2 weeks  If [place] normal and still spotting will check with Dr [place] to see if can use progesterone

## 2020-01-15 NOTE — Progress Notes (Signed)
Office Visit Note  Patient: Alexis Gomez             Date of Birth: March 01, 1986           MRN: [id number]             PCP: Sharilyn Sites, MD Referring: Sharilyn Sites, MD Visit Date: 01/28/2020 Occupation: '@GUAROCC' @  Subjective:  Pain in both ankle joints   History of Present Illness: Alexis Gomez is a 34 y.o. female with history of positive ANA and trochanteric bursitis.  Patient presents today to discuss lab work obtained on 10/09/19.  She continues to have persistent pain in both ankle joints and bilateral trochanteric bursa.  She was evaluated by Dr. Milinda Pointer and had a cortisone injection performed on 11/03/19. She has not noticed much improvement since having a cortisone injection.  She denies any tenderness but has persistent swelling and pain with range of motion and walking on her ankles.  In the past she has tried taking ibuprofen and Tylenol very sparingly for pain relief.  She has not taken oral prednisone recently.  She did not notice any improvement after the cortisone injection in the left trochanteric bursa on 10/27/2019. She tried performing some of the stretching exercises but has not performed them on a daily basis.   She denies any recent rashes, increased photosensitivity, symptoms of Raynaud's, oral nasal ulcerations, sicca symptoms, chest pain, or shortness of breath.  Activities of Daily Living:  Patient reports morning stiffness for 1  hour.   Patient Denies nocturnal pain.  Difficulty dressing/grooming: Denies Difficulty climbing stairs: Reports Difficulty getting out of chair: Reports Difficulty using hands for taps, buttons, cutlery, and/or writing: Denies  Review of Systems  Constitutional: Positive for fatigue.  HENT: Negative for mouth sores, mouth dryness and nose dryness.   Eyes: Negative for pain, visual disturbance and dryness.  Respiratory: Negative for cough, hemoptysis, shortness of breath and difficulty breathing.   Cardiovascular: Negative for chest  pain, palpitations, hypertension and swelling in legs/feet.  Gastrointestinal: Negative for blood in stool, constipation and diarrhea.  Endocrine: Negative for increased urination.  Genitourinary: Negative for painful urination.  Musculoskeletal: Positive for arthralgias, joint pain, myalgias, morning stiffness, muscle tenderness and myalgias. Negative for joint swelling and muscle weakness.  Skin: Positive for color change. Negative for pallor, rash, hair loss, nodules/bumps, skin tightness, ulcers and sensitivity to sunlight.  Allergic/Immunologic: Negative for susceptible to infections.  Neurological: Positive for weakness. Negative for dizziness, numbness and headaches.  Hematological: Negative for swollen glands.  Psychiatric/Behavioral: Negative for depressed mood and sleep disturbance. The patient is not nervous/anxious.     PMFS History:  Patient Active Problem List   Diagnosis Date Noted   Mass of right ovary 01/26/2020   Irregular bleeding 01/14/2020   Encounter for gynecological examination with Papanicolaou smear of cervix 01/14/2020   Boil of buttock 04/28/2015   Contraceptive management 05/19/2013   History of abnormal cervical Pap smear 05/19/2013    Past Medical History:  Diagnosis Date   Boil of buttock 04/28/2015   History of abnormal cervical Pap smear 05/19/2013   Has had laser cone in past   Intracranial hypertension    Pseudotumor cerebri    Vaginal Pap smear, abnormal     Family History  Problem Relation Age of Onset   Seizures Father    Heart disease Maternal Grandmother    Diabetes Maternal Grandmother    Stroke Maternal Grandmother    Hypertension Maternal Grandmother    Heart  disease Maternal Grandfather    Diabetes Maternal Grandfather    Stroke Maternal Grandfather    Heart disease Paternal Grandmother    Diabetes Paternal Grandmother    Heart disease Paternal Grandfather    Diabetes Paternal Grandfather    Past Surgical  History:  Procedure Laterality Date   COLPOSCOPY     laser cone     MOUTH SURGERY     x2   Social History   Social History Narrative   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 Malachi Bonds daily; Exercise - not now   Immunization History  Administered Date(s) Administered   Influenza,inj,Quad PF,6+ Mos 01/22/2017   Influenza-Unspecified 01/16/2013   PFIZER SARS-COV-2 Vaccination 11/27/2019, 12/18/2019     Objective: Vital Signs: BP 139/84 (BP Location: Left Arm, Patient Position: Sitting, Cuff Size: Normal)    Pulse 92    Resp 15    Ht '5\' 3"'  (1.6 m)    Wt 175 lb (79.4 kg)    BMI 31.00 kg/m    Physical Exam Vitals and nursing note reviewed.  Constitutional:      Appearance: She is well-developed.  HENT:     Head: Normocephalic and atraumatic.  Eyes:     Conjunctiva/sclera: Conjunctivae normal.  Pulmonary:     Effort: Pulmonary effort is normal.  Abdominal:     Palpations: Abdomen is soft.  Musculoskeletal:     Cervical back: Normal range of motion.  Skin:    General: Skin is warm and dry.     Capillary Refill: Capillary refill takes less than 2 seconds.  Neurological:     Mental Status: She is alert and oriented to person, place, and time.  Psychiatric:        Behavior: Behavior normal.      Musculoskeletal Exam: C-spine, thoracic spine, and lumbar spine have good range of motion.  Shoulder joints, elbow joints, wrist joints, MCPs, PIPs, DIPs have good range of motion with no synovitis.  She has tenderness over the right CMC joint.  She has complete fist formation bilaterally.  Hip joints have good range of motion.  She has tenderness over bilateral trochanteric bursa.  Knee joints have good range of motion with no warmth or effusion.  She has painful range of motion in both ankles with dorsiflexion and inversion.  Mild inflammation over the medial aspect of the tibialis anterior tendon of the left ankle.   No  tenderness of MTP joints.  No tenderness along the Achilles tendon or plantar fascia bilaterally.   CDAI Exam: CDAI Score: -- Patient Global: --; Provider Global: -- Swollen: --; Tender: -- Joint Exam 01/28/2020   No joint exam has been documented for this visit   There is currently no information documented on the homunculus. Go to the Rheumatology activity and complete the homunculus joint exam.  Investigation: No additional findings.  Imaging: US PELVIC COMPLETE WITH TRANSVAGINAL  Result Date: 01/24/2020 CLINICAL DATA:  Vaginal bleeding irregular, no beta HCG visualized in the chart. EXAM: TRANSABDOMINAL AND TRANSVAGINAL ULTRASOUND OF PELVIS TECHNIQUE: Both transabdominal and transvaginal ultrasound examinations of the pelvis were performed. Transabdominal technique was performed for global imaging of the pelvis including uterus, ovaries, adnexal regions, and pelvic cul-de-sac. It was necessary to proceed with endovaginal exam following the transabdominal exam to visualize the ovaries and adnexa. COMPARISON:  None FINDINGS: Uterus Measurements: 7.2 x 3.2 x 4.6cm = volume: 56 mL. There is an anterior 1.2 x 0.6 x  0.8 cm intramural lesion likely representing a uterine fibroid. No other mass visualized. Endometrium Thickness: 2m.  No focal abnormality visualized. Right ovary Measurements: 3.4 x 2.4 x 3.2cm = volume: 14 mL. Heterogeneous cystic structure with internal echoes and surrounding vascular flow measuing 2.3 x 1.8 x 1.9cm likely within the right ovary. No adnexal mass. Left ovary Measurements: 2.2 x 1.5 x 2.1cm = volume: 3 mL. Normal appearance/no adnexal mass. Other findings No abnormal free fluid. IMPRESSION: Heterogeneous 2.3 cm cystic structure with internal echoes and surrounding vascular flow likely within the right ovary. Another location such as within the fallopian tube is also a possibility but less likely (images obtained do not confirm 100% its location). Finding likely  represents a hemorrhagic cyst/endometrioma; however, an ectopic pregnancy could have the same appearance. Recommend quantitative beta HCG correlation. If patient is not pregnant, a follow-up ultrasound in 6-12 weeks could be considered to evaluate for resolution. If patient pregnant, an emergent repeat ultrasound is recommended. These results will be called to the ordering clinician or representative by the Radiologist Assistant, and communication documented in the PACS or [employer] Electronically Signed   By: MIven FinnM.D.   On: 01/24/2020 01:29    Recent Labs: Lab Results  Component Value Date   WBC 8.1 10/09/2019   HGB 13.6 10/09/2019   PLT 230 10/09/2019   NA 138 10/09/2019   K 3.6 10/09/2019   CL 103 10/09/2019   CO2 28 10/09/2019   GLUCOSE 105 (H) 10/09/2019   BUN 8 10/09/2019   CREATININE 0.55 10/09/2019   BILITOT 0.3 10/09/2019   ALKPHOS 47 11/22/2009   AST 36 (H) 10/09/2019   ALT 16 10/09/2019   PROT 7.5 10/09/2019   PROT 7.4 10/09/2019   ALBUMIN 4.1 11/22/2009   CALCIUM 9.3 10/09/2019   GFRAA 145 10/09/2019   QFTBGOLDPLUS NEGATIVE 10/09/2019    Speciality Comments: No specialty comments available.  Procedures:  No procedures performed Allergies: Patient has no known allergies.   Assessment / Plan:     Visit Diagnoses: Positive ANA (antinuclear antibody) - 10/09/19: ANA 1:320 nuclear, dense fine speckled, ESR 34, ENA negative: Lab results were discussed in detail with the patient today and all questions were addressed.  She has no clinical features of systemic lupus.  She has ongoing pain and inflammation on the anterior medial aspect of the left ankle. Dr. HMilinda Pointerinjected the tendon sheath on 11/03/19 but she has not noticed any improvement in her symptoms.  She has no other joint tenderness or synovitis on exam.  A prescription for celebrex 200 mg 1 capsule BID was sent to the pharmacy for the patient to try.  Future order for CBC, CMP, and ESR will be placed  today.  If her discomfort persists or worsens we will discuss a trial of plaquenil at her follow up visit in 2 months.  Plantar fasciitis, bilateral: She is not experiencing any discomfort due to plantar fasciitis.  She has no tenderness along the plantar fascia of her foot at this time.  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  No groin pain.  She has good ROM of the left hip joint with no discomfort.  She had a left trochanteric bursa cortisone injection performed on 10/27/2019 which provided temporary relief.  Anterior tibial tendonitis, right:  MRI of the left ankle was obtained on 10/24/2019 which revealed a small amount of  fluid within the anterior tibialis tendon sheath indicative of possible tenosynovitis.  She was evaluated by Dr. Milinda Pointer on 11/03/2019 and had a cortisone injection which did not relieve her discomfort. Her discomfort and inflammation has been refractory to oral prednisone and cortisone injections. She has no tenderness to palpation on examination today. She has painful ROM with dorsiflexion and invertion of the left ankle. Mild inflammation over the medial aspect of the tibialis anterior tendon noted.  We discussed a trial of Celebrex 200 mg 1 capsule by mouth twice daily for pain relief.  Potential side effects and instructions were provided to the patient.  We will recheck her lab work at her upcoming office visit. Future orders for CBC, CMP, and ESR will be placed today.  If she continues to have persistent pain and inflammation we will further discuss a trial of Plaquenil. She was encouraged to use the compression anklet provided by Dr. Milinda Pointer.   Medication monitoring encounter:  A prescription for Celebrex 200 mg 1 capsule by mouth twice daily was sent to the pharmacy.  Potential side effects were discussed.  Instructions were provided.  We will check CBC and CMP at her  follow up visit in 2 months.   Elevated sed rate: ESR was 34 on 10/09/19.  We will recheck ESR with upcoming lab work.  Other medical conditions are listed as follows:   Pseudotumor cerebri  Smoker   Orders: Orders Placed This Encounter  Procedures   COMPLETE METABOLIC PANEL WITH GFR   CBC with Differential/Platelet   Sedimentation rate   Meds ordered this encounter  Medications   celecoxib (CELEBREX) 200 MG capsule    Sig: Take 1 capsule (200 mg total) by mouth 2 (two) times daily.    Dispense:  180 capsule    Refill:  0    Follow-Up Instructions: Return in about 2 months (around 03/29/2020) for +ANA.   Ofilia Neas, PA-C  Note - This record has been created using Dragon software.  Chart creation errors have been sought, but may not always  have been located. Such creation errors do not reflect on  the standard of medical care.

## 2020-01-18 LAB — CYTOLOGY - PAP
Adequacy: ABSENT
Comment: NEGATIVE
Diagnosis: NEGATIVE
High risk HPV: NEGATIVE

## 2020-01-20 ENCOUNTER — Ambulatory Visit (HOSPITAL_COMMUNITY): Payer: No Typology Code available for payment source

## 2020-01-22 ENCOUNTER — Other Ambulatory Visit: Payer: Self-pay

## 2020-01-22 ENCOUNTER — Ambulatory Visit (HOSPITAL_COMMUNITY)
Admission: RE | Admit: 2020-01-22 | Discharge: 2020-01-22 | Disposition: A | Discharge status: Home / Self Care | Payer: No Typology Code available for payment source | Source: Ambulatory Visit | Attending: Adult Health | Admitting: Adult Health

## 2020-01-22 DIAGNOSIS — N926 Irregular menstruation, unspecified: Secondary | ICD-10-CM | POA: Insufficient documentation

## 2020-01-26 ENCOUNTER — Telehealth: Payer: Self-pay | Admitting: Adult Health

## 2020-01-26 ENCOUNTER — Encounter: Payer: Self-pay | Admitting: Adult Health

## 2020-01-26 DIAGNOSIS — N838 Other noninflammatory disorders of ovary, fallopian tube and broad ligament: Secondary | ICD-10-CM | POA: Insufficient documentation

## 2020-01-26 DIAGNOSIS — N926 Irregular menstruation, unspecified: Secondary | ICD-10-CM

## 2020-01-26 NOTE — Telephone Encounter (Signed)
Pt aware of [place], will check QHCG this week,  and placed in recall for 6 week follow to order [place] then. Could be cyst or endometrioma, unlikely ectopic due to no sex in over a year.

## 2020-01-28 ENCOUNTER — Other Ambulatory Visit: Payer: Self-pay | Admitting: Physician Assistant

## 2020-01-28 ENCOUNTER — Ambulatory Visit: Payer: No Typology Code available for payment source | Admitting: Physician Assistant

## 2020-01-28 ENCOUNTER — Encounter: Payer: Self-pay | Admitting: Physician Assistant

## 2020-01-28 ENCOUNTER — Other Ambulatory Visit: Payer: Self-pay

## 2020-01-28 VITALS — BP 139/84 | HR 92 | Resp 15 | Ht 63.0 in | Wt 175.0 lb

## 2020-01-28 DIAGNOSIS — G932 Benign intracranial hypertension: Secondary | ICD-10-CM | POA: Diagnosis not present

## 2020-01-28 DIAGNOSIS — M7062 Trochanteric bursitis, left hip: Secondary | ICD-10-CM | POA: Diagnosis not present

## 2020-01-28 DIAGNOSIS — R768 Other specified abnormal immunological findings in serum: Secondary | ICD-10-CM

## 2020-01-28 DIAGNOSIS — M722 Plantar fascial fibromatosis: Secondary | ICD-10-CM | POA: Diagnosis not present

## 2020-01-28 DIAGNOSIS — F17200 Nicotine dependence, unspecified, uncomplicated: Secondary | ICD-10-CM

## 2020-01-28 DIAGNOSIS — Z5181 Encounter for therapeutic drug level monitoring: Secondary | ICD-10-CM

## 2020-01-28 DIAGNOSIS — M76811 Anterior tibial syndrome, right leg: Secondary | ICD-10-CM

## 2020-01-28 DIAGNOSIS — R700 Elevated erythrocyte sedimentation rate: Secondary | ICD-10-CM

## 2020-01-28 LAB — BETA HCG QUANT (REF LAB): hCG Quant: <1 m[IU]/mL

## 2020-01-28 MED ORDER — CELECOXIB 200 MG PO CAPS
200.0000 mg | ORAL_CAPSULE | Freq: Two times a day (BID) | ORAL | 0 refills | Status: DC
Start: 2020-01-28 — End: 2020-01-28

## 2020-02-22 MED FILL — CELECOXIB 200 MG CAP: 200 | 60 days supply | Qty: 120 | Fill #0

## 2020-03-08 ENCOUNTER — Ambulatory Visit (INDEPENDENT_AMBULATORY_CARE_PROVIDER_SITE_OTHER): Payer: No Typology Code available for payment source | Admitting: Adult Health

## 2020-03-08 ENCOUNTER — Other Ambulatory Visit: Payer: Self-pay

## 2020-03-08 ENCOUNTER — Encounter: Payer: Self-pay | Admitting: Adult Health

## 2020-03-08 VITALS — BP 130/86 | HR 85 | Ht 63.0 in | Wt 174.0 lb

## 2020-03-08 DIAGNOSIS — N83291 Other ovarian cyst, right side: Secondary | ICD-10-CM | POA: Insufficient documentation

## 2020-03-08 DIAGNOSIS — Z3009 Encounter for other general counseling and advice on contraception: Secondary | ICD-10-CM | POA: Diagnosis not present

## 2020-03-08 NOTE — Progress Notes (Signed)
  Subjective:     Patient ID: Alexis Gomez, female   DOB: [birth year], 34 y.o.   MRN: [id number]  HPI [name] is a 34 year old white female, single, G0P0 back in follow up on right ovarian cyst. Irregular bleeding stopped, did have some discomfort with ovulation.  PCP is Dr Phillips Odor.  Review of Systems Irregular bleeding stopped Had some ovulation discomfort Had sex 11/27 first time in over a year Reviewed past medical,surgical, social and family history. Reviewed medications and allergies.     Objective:   Physical Exam BP 130/86 (BP Location: Left Arm, Patient Position: Sitting, Cuff Size: Normal)   Pulse 85   Ht 5\' 3"  (1.6 m)   Wt 174 lb (78.9 kg)   LMP 02/26/2020   BMI 30.82 kg/m  Skin warm and dry. Lungs: clear to ausculation bilaterally. Cardiovascular: regular rate and rhythm.   PHQ 2 score is 0   Upstream - 03/08/20 0852      Pregnancy Intention Screening   Does the patient want to become pregnant in the next year? No    Does the patient's partner want to become pregnant in the next year? No    Would the patient like to discuss contraceptive options today? Yes      Contraception Wrap Up   Current Method Female Condom    End Method Female Condom    Contraception Counseling Provided Yes          Assessment:     1. Complex cyst of right ovary Will get follow up 03/10/20 in about 1 week   2. Encounter for general counseling and advice on contraceptive management Discussed IUDs, she is interested in paragard, discussed it and mirena, and gave handouts she will check with neurologist about the mirena first    Will get [place] firt to se if cyst resolved, and will place IUD when on her period, will give Cytotec in am and IUD in pm  Plan:     Return in 1 week for GYN [place] to assess right ovary cyst

## 2020-03-14 ENCOUNTER — Telehealth: Payer: Self-pay | Admitting: Neurology

## 2020-03-14 NOTE — Telephone Encounter (Signed)
Please advise 

## 2020-03-14 NOTE — Telephone Encounter (Signed)
The best option would be a copper IUD, not Mirena.  However, the risk of developing idiopathic intracranial hypertension is low, so if that is the only option, she can try it but will have to continue close monitoring with eye exams .

## 2020-03-14 NOTE — Telephone Encounter (Signed)
Patient called and said, "I have a right ovarian cyst and was taken off my birth control. My GYN wanted me to ask Dr. Everlena Cooper if the Mirena IUD may be a good fit for me based on my history?"

## 2020-03-15 NOTE — Telephone Encounter (Signed)
Patient advised and she will contact her GYN.

## 2020-03-16 NOTE — Progress Notes (Signed)
Office Visit Note  Patient: Alexis Gomez             Date of Birth: [birth year]           MRN: [id number]             PCP: Sharilyn Sites, MD Referring: Sharilyn Sites, MD Visit Date: 03/30/2020 Occupation: @GUAROCC @  Subjective:  Medication management   History of Present Illness: Alexis Gomez is a 34 y.o. female with history of tendinitis and positive ANA.  She states that she has been taking Celebrex most of her joint pain has resolved.  She has not had any recurrence of trochanteric bursitis or ankle joint inflammation.  She denies any episodes of plantar fasciitis.  She states all her joints pain-free.  She is been tolerating Celebrex well.  She states for the last couple of days she has been having some headaches.  She denies any history of oral ulcers, nasal ulcers, malar rash, sicca symptoms, photosensitivity, Raynaud's phenomenon or lymphadenopathy.  Activities of Daily Living:  Patient reports morning stiffness for a few minutes.   Patient Reports nocturnal pain.  Difficulty dressing/grooming: Denies Difficulty climbing stairs: Denies Difficulty getting out of chair: Denies Difficulty using hands for taps, buttons, cutlery, and/or writing: Denies  Review of Systems  Constitutional: Negative for fatigue.  HENT: Negative for mouth sores, mouth dryness and nose dryness.   Eyes: Negative for pain, itching and dryness.  Respiratory: Negative for shortness of breath and difficulty breathing.   Cardiovascular: Negative for chest pain and palpitations.  Gastrointestinal: Negative for blood in stool, constipation and diarrhea.  Endocrine: Negative for increased urination.  Genitourinary: Negative for difficulty urinating.  Musculoskeletal: Positive for arthralgias, joint pain and morning stiffness. Negative for joint swelling, myalgias, muscle tenderness and myalgias.  Skin: Negative for color change, rash and redness.  Allergic/Immunologic: Negative for susceptible to  infections.  Neurological: Positive for headaches. Negative for dizziness, numbness, memory loss and weakness.  Hematological: Negative for bruising/bleeding tendency.  Psychiatric/Behavioral: Negative for confusion.    PMFS History:  Patient Active Problem List   Diagnosis Date Noted  . Encounter for IUD insertion 03/29/2020  . Complex cyst of right ovary 03/08/2020  . Mass of right ovary 01/26/2020  . Irregular bleeding 01/14/2020  . Boil of buttock 04/28/2015  . History of abnormal cervical Pap smear 05/19/2013    Past Medical History:  Diagnosis Date  . Boil of buttock 04/28/2015  . History of abnormal cervical Pap smear 05/19/2013   Has had laser cone in past  . Intracranial hypertension   . Pseudotumor cerebri   . Vaginal Pap smear, abnormal     Family History  Problem Relation Age of Onset  . Seizures Father   . Heart disease Maternal Grandmother   . Diabetes Maternal Grandmother   . Stroke Maternal Grandmother   . Hypertension Maternal Grandmother   . Heart disease Maternal Grandfather   . Diabetes Maternal Grandfather   . Stroke Maternal Grandfather   . Heart disease Paternal Grandmother   . Diabetes Paternal Grandmother   . Heart disease Paternal Grandfather   . Diabetes Paternal Grandfather    Past Surgical History:  Procedure Laterality Date  . COLPOSCOPY    . laser cone    . 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 Malachi Bonds daily;  Exercise - not now   Immunization History  Administered Date(s) Administered  . Influenza,inj,Quad PF,6+ Mos 01/22/2017  . Influenza-Unspecified 01/16/2013  . PFIZER SARS-COV-2 Vaccination 11/27/2019, 12/18/2019     Objective: Vital Signs: BP 129/90 (BP Location: Left Arm, Patient Position: Sitting, Cuff Size: Normal)   Pulse 90   Resp 14   Ht $R'5\' 3"'cx$  (1.6 m)   Wt 176 lb 9.6 oz (80.1 kg)   LMP  03/22/2020   BMI 31.28 kg/m    Physical Exam Vitals and nursing note reviewed.  Constitutional:      Appearance: She is well-developed and well-nourished.  HENT:     Head: Normocephalic and atraumatic.  Eyes:     Extraocular Movements: EOM normal.     Conjunctiva/sclera: Conjunctivae normal.  Cardiovascular:     Rate and Rhythm: Normal rate and regular rhythm.     Pulses: Intact distal pulses.     Heart sounds: Normal heart sounds.  Pulmonary:     Effort: Pulmonary effort is normal.     Breath sounds: Normal breath sounds.  Abdominal:     General: Bowel sounds are normal.     Palpations: Abdomen is soft.  Musculoskeletal:     Cervical back: Normal range of motion.  Lymphadenopathy:     Cervical: No cervical adenopathy.  Skin:    General: Skin is warm and dry.     Capillary Refill: Capillary refill takes less than 2 seconds.  Neurological:     Mental Status: She is alert and oriented to person, place, and time.  Psychiatric:        Mood and Affect: Mood and affect normal.        Behavior: Behavior normal.      Musculoskeletal Exam: C-spine, thoracic and lumbar spine with good range of motion.  Shoulder joints, elbow joints, wrist joints, MCPs PIPs and DIPs with good range of motion with no synovitis.  Hip joints, knee joints, ankles, MTPs and PIPs with good range of motion with no synovitis.  She had no plantar fasciitis or tendinitis in her ankles.  CDAI Exam: CDAI Score: -- Patient Global: --; Provider Global: -- Swollen: --; Tender: -- Joint Exam 03/30/2020   No joint exam has been documented for this visit   There is currently no information documented on the homunculus. Go to the Rheumatology activity and complete the homunculus joint exam.  Investigation: No additional findings.  Imaging: US PELVIC COMPLETE WITH TRANSVAGINAL  Result Date: 03/17/2020 CLINICAL DATA:  Complex cyst of RIGHT ovary, follow-up; LMP 02/29/2020 EXAM: TRANSABDOMINAL AND TRANSVAGINAL  ULTRASOUND OF PELVIS TECHNIQUE: Both transabdominal and transvaginal ultrasound examinations of the pelvis were performed. Transabdominal technique was performed for global imaging of the pelvis including uterus, ovaries, adnexal regions, and pelvic cul-de-sac. It was necessary to proceed with endovaginal exam following the transabdominal exam to visualize the uterus, endometrium, and ovaries. COMPARISON:  01/22/2020 FINDINGS: Uterus Measurements: 5.3 x 3.3 x 3.8 cm = volume: 35 mL. Retroverted. Probable small leiomyoma intramural at posterior upper uterus 10 x 9 x 8 mm. Remainder of uterus unremarkable. Endometrium Thickness: 4 mm.  No endometrial fluid or focal abnormality Right ovary Measurements: 2.3 x 1.3 x 2.4 cm = volume: 3.8 mL. Normal morphology without mass. Complex cyst seen on previous exam no longer identified. Left ovary Measurements: 2.3 x 1.3 x 2.9 cm = volume: 3.7 mL. Normal morphology without mass Other findings No free pelvic fluid.  No adnexal masses. IMPRESSION: Small intramural leiomyoma at posterior upper uterus 10  mm diameter. Resolution of complex cyst seen within RIGHT ovary on previous exam. Electronically Signed   By: Lavonia Dana M.D.   On: 03/17/2020 14:45    Recent Labs: Lab Results  Component Value Date   WBC 8.1 10/09/2019   HGB 13.6 10/09/2019   PLT 230 10/09/2019   NA 138 10/09/2019   K 3.6 10/09/2019   CL 103 10/09/2019   CO2 28 10/09/2019   GLUCOSE 105 (H) 10/09/2019   BUN 8 10/09/2019   CREATININE 0.55 10/09/2019   BILITOT 0.3 10/09/2019   ALKPHOS 47 11/22/2009   AST 36 (H) 10/09/2019   ALT 16 10/09/2019   PROT 7.5 10/09/2019   PROT 7.4 10/09/2019   ALBUMIN 4.1 11/22/2009   CALCIUM 9.3 10/09/2019   GFRAA 145 10/09/2019   QFTBGOLDPLUS NEGATIVE 10/09/2019    Speciality Comments: No specialty comments available.  Procedures:  No procedures performed Allergies: Patient has no known allergies.   Assessment / Plan:     Visit Diagnoses: Positive ANA  (antinuclear antibody) - 10/09/19: ANA 1:320 nuclear, dense fine speckled, ESR 34, ENA negative: There is no history of oral ulcers, nasal ulcers, malar rash, photosensitivity, Raynaud's phenomenon, sicca symptoms or lymphadenopathy.  Plantar fasciitis, bilateral-she has had no recurrence since she has been on Celebrex.  Trochanteric bursitis, left hip - left trochanteric bursa cortisone injection performed on 10/27/2019 which provided temporary relief.  She states that trochanteric bursitis has resolved now.  Anterior tibial tendonitis, right - MRI of the left ankle was obtained on 10/24/2019 which revealed a small amount of fluid within the anterior tibialis tendon sheath indicative of possible tenosynovitis -she had no ankle joint pain or tenderness.  She is doing very well on Celebrex.  Plan: Sedimentation rate  Medication monitoring encounter - celebrex $RemoveBe'200mg'ToNRsZoPn$  by mouth twice daily.  Side effects of long-term NSAID use was discussed.  She is doing well I advised her to reduce Celebrex to once a day.  If she has increased discomfort she can go to twice daily dosing.  She was advised to take Celebrex with her meals.  We will continue to monitor labs every 6 months while she is on Celebrex.- Plan: CBC with Differential/Platelet, COMPLETE METABOLIC PANEL WITH GFR  Elevated blood pressure reading-she states she has been experiencing headaches for the last 2 days.  Her blood pressure was elevated today.  Which could be related to Celebrex use.  Have advised her to reduce Celebrex.  I have also advised her to monitor blood pressure closely.  She states her grandfather has a blood pressure monitor and she can check her blood pressure for the next few days.  Smoking cessation was also discussed.  BMI 31.28- weight loss diet and exercise was emphasized.  Elevated sed rate-we will repeat sedimentation rate today.  Pseudotumor cerebri  Smoker-smoking cessation was discussed.  Educated about COVID-19 virus  infection-she is fully vaccinated against COVID-19.  Have advised her to get a booster in 6 months after the last dose.  Use of mask, social distancing and hand hygiene was discussed.  Orders: Orders Placed This Encounter  Procedures  . CBC with Differential/Platelet  . COMPLETE METABOLIC PANEL WITH GFR  . Sedimentation rate   No orders of the defined types were placed in this encounter.     Follow-Up Instructions: No follow-ups on file.   Bo Merino, MD  Note - This record has been created using Editor, commissioning.  Chart creation errors have been sought, but may not always  have  been located. Such creation errors do not reflect on  the standard of medical care.

## 2020-03-17 ENCOUNTER — Other Ambulatory Visit: Payer: Self-pay

## 2020-03-17 ENCOUNTER — Ambulatory Visit (HOSPITAL_COMMUNITY)
Admission: RE | Admit: 2020-03-17 | Discharge: 2020-03-17 | Disposition: A | Discharge status: Home / Self Care | Payer: No Typology Code available for payment source | Source: Ambulatory Visit | Attending: Adult Health | Admitting: Adult Health

## 2020-03-17 ENCOUNTER — Other Ambulatory Visit: Payer: No Typology Code available for payment source

## 2020-03-17 DIAGNOSIS — N83291 Other ovarian cyst, right side: Secondary | ICD-10-CM | POA: Diagnosis not present

## 2020-03-21 ENCOUNTER — Other Ambulatory Visit: Payer: Self-pay | Admitting: Advanced Practice Midwife

## 2020-03-21 ENCOUNTER — Ambulatory Visit (HOSPITAL_COMMUNITY): Payer: No Typology Code available for payment source

## 2020-03-21 ENCOUNTER — Telehealth: Payer: Self-pay | Admitting: Adult Health

## 2020-03-21 MED ORDER — MISOPROSTOL 200 MCG PO TABS: ORAL_TABLET | ORAL | 0 refills | Status: DC

## 2020-03-21 NOTE — Telephone Encounter (Signed)
Patient states someone has already called her from our office.

## 2020-03-21 NOTE — Progress Notes (Signed)
error 

## 2020-03-21 NOTE — Progress Notes (Signed)
Premedicate w/cytotec for IUD insertion.  Rx cytotec 5-6 hours before appt.

## 2020-03-29 ENCOUNTER — Other Ambulatory Visit: Payer: Self-pay

## 2020-03-29 ENCOUNTER — Encounter: Payer: Self-pay | Admitting: Women's Health

## 2020-03-29 ENCOUNTER — Ambulatory Visit (INDEPENDENT_AMBULATORY_CARE_PROVIDER_SITE_OTHER): Payer: No Typology Code available for payment source | Admitting: Women's Health

## 2020-03-29 VITALS — BP 133/85 | HR 102 | Ht 63.0 in | Wt 176.4 lb

## 2020-03-29 DIAGNOSIS — Z30430 Encounter for insertion of intrauterine contraceptive device: Secondary | ICD-10-CM | POA: Diagnosis not present

## 2020-03-29 DIAGNOSIS — Z3202 Encounter for pregnancy test, result negative: Secondary | ICD-10-CM | POA: Diagnosis not present

## 2020-03-29 LAB — POCT URINE PREGNANCY: Preg Test, Ur: NEGATIVE

## 2020-03-29 MED ORDER — PARAGARD INTRAUTERINE COPPER IU IUD: INTRAUTERINE_SYSTEM | Freq: Once | INTRAUTERINE | Status: AC

## 2020-03-29 NOTE — Patient Instructions (Signed)
 Nothing in vagina for 3 days (no sex, douching, tampons, etc...)  Check your strings once a month to make sure you can feel them, if you are not able to please let us know  If you develop a fever of 100.4 or more in the next few weeks, or if you develop severe abdominal pain, please let us know  Use a backup method of birth control, such as condoms, for 2 weeks     Intrauterine Device Insertion, Care After  This sheet gives you information about how to care for yourself after your procedure. Your health care provider may also give you more specific instructions. If you have problems or questions, contact your health care provider. What can I expect after the procedure? After the procedure, it is common to have:  Cramps and pain in the abdomen.  Light bleeding (spotting) or heavier bleeding that is like your menstrual period. This may last for up to a few days.  Lower back pain.  Dizziness.  Headaches.  Nausea. Follow these instructions at home:  Before resuming sexual activity, check to make sure that you can feel the IUD string(s). You should be able to feel the end of the string(s) below the opening of your cervix. If your IUD string is in place, you may resume sexual activity. ? If you had a hormonal IUD inserted more than 7 days after your most recent period started, you will need to use a backup method of birth control for 7 days after IUD insertion. Ask your health care provider whether this applies to you.  Continue to check that the IUD is still in place by feeling for the string(s) after every menstrual period, or once a month.  Take over-the-counter and prescription medicines only as told by your health care provider.  Do not drive or use heavy machinery while taking prescription pain medicine.  Keep all follow-up visits as told by your health care provider. This is important. Contact a health care provider if:  You have bleeding that is heavier or lasts longer  than a normal menstrual cycle.  You have a fever.  You have cramps or abdominal pain that get worse or do not get better with medicine.  You develop abdominal pain that is new or is not in the same area of earlier cramping and pain.  You feel lightheaded or weak.  You have abnormal or bad-smelling discharge from your vagina.  You have pain during sexual activity.  You have any of the following problems with your IUD string(s): ? The string bothers or hurts you or your sexual partner. ? You cannot feel the string. ? The string has gotten longer.  You can feel the IUD in your vagina.  You think you may be pregnant, or you miss your menstrual period.  You think you may have an STI (sexually transmitted infection). Get help right away if:  You have flu-like symptoms.  You have a fever and chills.  You can feel that your IUD has slipped out of place. Summary  After the procedure, it is common to have cramps and pain in the abdomen. It is also common to have light bleeding (spotting) or heavier bleeding that is like your menstrual period.  Continue to check that the IUD is still in place by feeling for the string(s) after every menstrual period, or once a month.  Keep all follow-up visits as told by your health care provider. This is important.  Contact your health care provider   if you have problems with your IUD string(s), such as the string getting longer or bothering you or your sexual partner. This information is not intended to replace advice given to you by your health care provider. Make sure you discuss any questions you have with your health care provider. Document Revised: 03/08/2017 Document Reviewed: 02/15/2016 Elsevier Patient Education  2020 [employer]  

## 2020-03-29 NOTE — Addendum Note (Signed)
Addended by: Colen Darling on: 03/29/2020 05:09 PM   Modules accepted: Orders

## 2020-03-29 NOTE — Progress Notes (Signed)
   IUD INSERTION Patient name: Alexis Gomez MRN [id number]  Date of birth: Jan 29, 1986 Subjective Findings:   Alexis Gomez is a 34 y.o. G0P0000 Caucasian female being seen today for insertion of a Paragard IUD.  Depression screen [facility] 2/9 03/08/2020 01/14/2020 08/19/2017 08/14/2016  Decreased Interest 0 0 0 0  Down, Depressed, Hopeless 0 0 0 0  PHQ - 2 Score 0 0 0 0  Altered sleeping - 1 - -  Tired, decreased energy - 1 - -  Change in appetite - 1 - -  Feeling bad or failure about yourself  - 0 - -  Trouble concentrating - 0 - -  Moving slowly or fidgety/restless - 0 - -  Suicidal thoughts - 0 - -  PHQ-9 Score - 3 - -    Patient's last menstrual period was 03/22/2020. Last sexual intercourse was >2wks ago Last pap10/7/21. Results were:  normal  The risks and benefits of the method and placement have been thouroughly reviewed with the patient and all questions were answered.  Specifically the patient is aware of failure rate of 04/998, expulsion of the IUD and of possible perforation.  The patient is aware of irregular bleeding due to the method and understands the incidence of irregular bleeding diminishes with time.  Signed copy of informed consent in chart.  Pertinent History Reviewed:   Reviewed past medical,surgical, social, obstetrical and family history.  Reviewed problem list, medications and allergies. Objective Findings & Procedure:   Vitals:   03/29/20 1614  BP: 133/85  Pulse: (!) 102  Weight: 176 lb 6.4 oz (80 kg)  Height: 5\' 3"  (1.6 m)  Body mass index is 31.25 kg/m.  Results for orders placed or performed in visit on 03/29/20 (from the past 24 hour(s))  POCT urine pregnancy   Collection Time: 03/29/20  4:14 PM  Result Value Ref Range   Preg Test, Ur Negative Negative     Time out was performed.  A graves speculum was placed in the vagina.  The cervix was visualized, prepped using Betadine, and grasped with a single tooth tenaculum. The uterus was found to be  retroflexed and it sounded to 7 cm.  Paragard  IUD placed per manufacturer's recommendations. The strings were trimmed to approximately 3 cm. The patient tolerated the procedure well.   Informal transvaginal sonogram was performed and the proper placement of the IUD was verified.  Chaperone: 03/31/20   Assessment & Plan:   1) Paragard IUD insertion The patient was given post procedure instructions, including signs and symptoms of infection and to check for the strings after each menses or each month, and refraining from intercourse or anything in the vagina for 3 days. She was given a care card with date IUD placed, and date IUD to be removed. She is scheduled for a f/u appointment in 4 weeks.  Orders Placed This Encounter  Procedures  . POCT urine pregnancy    Return in about 4 weeks (around 04/26/2020) for IUD F/U, in person, CNM.  04/28/2020 CNM, [facility] 03/29/2020 5:02 PM

## 2020-03-30 ENCOUNTER — Encounter: Payer: Self-pay | Admitting: Rheumatology

## 2020-03-30 ENCOUNTER — Ambulatory Visit: Payer: No Typology Code available for payment source | Admitting: Rheumatology

## 2020-03-30 VITALS — BP 129/90 | HR 90 | Resp 14 | Ht 63.0 in | Wt 176.6 lb

## 2020-03-30 DIAGNOSIS — R768 Other specified abnormal immunological findings in serum: Secondary | ICD-10-CM | POA: Diagnosis not present

## 2020-03-30 DIAGNOSIS — M7062 Trochanteric bursitis, left hip: Secondary | ICD-10-CM | POA: Diagnosis not present

## 2020-03-30 DIAGNOSIS — Z7189 Other specified counseling: Secondary | ICD-10-CM

## 2020-03-30 DIAGNOSIS — M722 Plantar fascial fibromatosis: Secondary | ICD-10-CM

## 2020-03-30 DIAGNOSIS — M76811 Anterior tibial syndrome, right leg: Secondary | ICD-10-CM

## 2020-03-30 DIAGNOSIS — Z6831 Body mass index (BMI) 31.0-31.9, adult: Secondary | ICD-10-CM

## 2020-03-30 DIAGNOSIS — R700 Elevated erythrocyte sedimentation rate: Secondary | ICD-10-CM

## 2020-03-30 DIAGNOSIS — F17200 Nicotine dependence, unspecified, uncomplicated: Secondary | ICD-10-CM

## 2020-03-30 DIAGNOSIS — Z5181 Encounter for therapeutic drug level monitoring: Secondary | ICD-10-CM

## 2020-03-30 DIAGNOSIS — R030 Elevated blood-pressure reading, without diagnosis of hypertension: Secondary | ICD-10-CM

## 2020-03-30 DIAGNOSIS — G932 Benign intracranial hypertension: Secondary | ICD-10-CM

## 2020-03-30 NOTE — Patient Instructions (Signed)

## 2020-03-31 LAB — COMPLETE METABOLIC PANEL WITH GFR
AG Ratio: 1.4 (calc) (ref 1.0–2.5)
ALT: 17 U/L (ref 6–29)
AST: 17 U/L (ref 10–30)
Albumin: 4.2 g/dL (ref 3.6–5.1)
Alkaline phosphatase (APISO): 96 U/L (ref 31–125)
BUN: 8 mg/dL (ref 7–25)
CO2: 27 mmol/L (ref 20–32)
Calcium: 9.5 mg/dL (ref 8.6–10.2)
Chloride: 103 mmol/L (ref 98–110)
Creat: 0.58 mg/dL (ref 0.50–1.10)
GFR, Est African American: 141 mL/min/{1.73_m2} (ref >=60)
GFR, Est Non African American: 122 mL/min/{1.73_m2} (ref >=60)
Globulin: 3.1 g/dL (calc) (ref 1.9–3.7)
Glucose, Bld: 83 mg/dL (ref 65–99)
Potassium: 4.5 mmol/L (ref 3.5–5.3)
Sodium: 140 mmol/L (ref 135–146)
Total Bilirubin: 0.3 mg/dL (ref 0.2–1.2)
Total Protein: 7.3 g/dL (ref 6.1–8.1)

## 2020-03-31 LAB — CBC WITH DIFFERENTIAL/PLATELET
Absolute Monocytes: 608 cells/uL (ref 200–950)
Basophils Absolute: 59 cells/uL (ref 0–200)
Basophils Relative: 0.5 %
Eosinophils Absolute: 94 cells/uL (ref 15–500)
Eosinophils Relative: 0.8 %
HCT: 39.7 % (ref 35.0–45.0)
Hemoglobin: 13.3 g/dL (ref 11.7–15.5)
Lymphs Abs: 3545 cells/uL (ref 850–3900)
MCH: 29.8 pg (ref 27.0–33.0)
MCHC: 33.5 g/dL (ref 32.0–36.0)
MCV: 88.8 fL (ref 80.0–100.0)
MPV: 10.2 fL (ref 7.5–12.5)
Monocytes Relative: 5.2 %
Neutro Abs: 7394 cells/uL (ref 1500–7800)
Neutrophils Relative %: 63.2 %
Platelets: 257 10*3/uL (ref 140–400)
RBC: 4.47 10*6/uL (ref 3.80–5.10)
RDW: 12.3 % (ref 11.0–15.0)
Total Lymphocyte: 30.3 %
WBC: 11.7 10*3/uL — ABNORMAL HIGH (ref 3.8–10.8)

## 2020-03-31 LAB — SEDIMENTATION RATE: Sed Rate: 38 mm/h — ABNORMAL HIGH (ref 0–20)

## 2020-03-31 NOTE — Progress Notes (Signed)
WBC count is mildly elevated.  CMP is normal.  Sed rate is mildly elevated and stable.  No change in treatment advised.

## 2020-04-19 ENCOUNTER — Encounter: Payer: Self-pay | Admitting: *Deleted

## 2020-04-19 ENCOUNTER — Ambulatory Visit: Payer: No Typology Code available for payment source | Admitting: Adult Health

## 2020-04-27 ENCOUNTER — Ambulatory Visit: Payer: No Typology Code available for payment source | Admitting: Women's Health

## 2020-05-04 ENCOUNTER — Encounter: Payer: Self-pay | Admitting: Advanced Practice Midwife

## 2020-05-04 ENCOUNTER — Other Ambulatory Visit: Payer: Self-pay

## 2020-05-04 ENCOUNTER — Ambulatory Visit (INDEPENDENT_AMBULATORY_CARE_PROVIDER_SITE_OTHER): Payer: No Typology Code available for payment source | Admitting: Advanced Practice Midwife

## 2020-05-04 VITALS — BP 124/82 | HR 90 | Ht 63.0 in | Wt 176.0 lb

## 2020-05-04 DIAGNOSIS — Z30431 Encounter for routine checking of intrauterine contraceptive device: Secondary | ICD-10-CM

## 2020-05-04 MED ORDER — NAPROXEN 500 MG PO TABS: 500.0000 mg | ORAL_TABLET | Freq: Two times a day (BID) | ORAL | 3 refills | Status: DC

## 2020-05-04 NOTE — Progress Notes (Signed)
   GYN VISIT Patient name: Alexis Gomez MRN [id number]  Date of birth: 03/13/1986 Chief Complaint:   Follow-up  History of Present Illness:   Alexis Gomez is a 35 y.o. G0P0000 Caucasian female being seen today for IUD check up. Notes intermittent low abd pressure since placement; some discomfort during sex, position-dependent; has had 1 cycle since placement and states it was typical in length but slightly heavier.     Depression screen [facility] 2/9 03/08/2020 01/14/2020 08/19/2017 08/14/2016  Decreased Interest 0 0 0 0  Down, Depressed, Hopeless 0 0 0 0  PHQ - 2 Score 0 0 0 0  Altered sleeping - 1 - -  Tired, decreased energy - 1 - -  Change in appetite - 1 - -  Feeling bad or failure about yourself  - 0 - -  Trouble concentrating - 0 - -  Moving slowly or fidgety/restless - 0 - -  Suicidal thoughts - 0 - -  PHQ-9 Score - 3 - -    Patient's last menstrual period was 04/13/2020 (exact date). The current method of family planning is IUD.  Last pap Oct 2021. Results were: NILM w/ HRHPV negative Review of Systems:   Pertinent items are noted in HPI Denies fever/chills, dizziness, headaches, visual disturbances, fatigue, shortness of breath, chest pain, abdominal pain, vomiting, abnormal vaginal discharge/itching/odor/irritation, problems with periods, bowel movements, urination, or intercourse unless otherwise stated above.  Pertinent History Reviewed:  Reviewed past medical,surgical, social, obstetrical and family history.  Reviewed problem list, medications and allergies. Physical Assessment:   Vitals:   05/04/20 1505  BP: 124/82  Pulse: 90  Weight: 176 lb (79.8 kg)  Height: 5\' 3"  (1.6 m)  Body mass index is 31.18 kg/m.       Physical Examination:   General appearance: alert, well appearing, and in no distress  Mental status: alert, oriented to person, place, and time  Skin: warm & dry   Cardiovascular: normal heart rate noted  Respiratory: normal respiratory effort, no  distress  Abdomen: soft, non-tender   Pelvic: normal external genitalia, vulva, vagina, cervix, uterus and adnexa; strings visualized approx 3cm from os  Extremities: no edema   Chaperone: Angel Neas    No results found for this or any previous visit (from the past 24 hour(s)).  Assessment & Plan:  1) IUD check-up> good placement; will give Naprosyn for discomfort as needed (hold on Celexa while taking); rec positions of comfort during sex- this shouldn't be an issue as time goes on  Meds:  Meds ordered this encounter  Medications  . naproxen (NAPROSYN) 500 MG tablet    Sig: Take 1 tablet (500 mg total) by mouth 2 (two) times daily with a meal.    Dispense:  30 tablet    Refill:  3    Order Specific Question:   Supervising Provider    Answer:   , LUTHER H [[id number]]    No orders of the defined types were placed in this encounter.   Return for Physical, Nov 2022.  Dec 2022 CNM 05/04/2020 3:25 PM

## 2020-05-12 ENCOUNTER — Other Ambulatory Visit (HOSPITAL_COMMUNITY): Payer: Self-pay | Admitting: Oral and Maxillofacial Surgery

## 2020-05-12 MED FILL — CHLORHEXIDINE 0.12% RINSE: 0.12 | 15 days supply | Qty: 473 | Fill #0

## 2020-05-12 MED FILL — DEXAMETHASONE 4 MG TABLET: 4 | 3 days supply | Qty: 9 | Fill #0

## 2020-05-12 MED FILL — AMOXICILLIN 500 MG CAPSULE: 500 | 7 days supply | Qty: 21 | Fill #0

## 2020-05-12 MED FILL — IBUPROFEN 400 MG TABS: 400 | 5 days supply | Qty: 30 | Fill #0

## 2020-05-12 MED FILL — HYDROCODON-APAP 5-325: 5-325 | 1 days supply | Qty: 5 | Fill #0

## 2020-07-06 ENCOUNTER — Telehealth: Payer: Self-pay

## 2020-07-06 DIAGNOSIS — G932 Benign intracranial hypertension: Secondary | ICD-10-CM

## 2020-07-06 NOTE — Telephone Encounter (Signed)
Tried calling Alexis Gomez, No answer. LMOVM please call the office, Per DR.Jaffe Alexis Gomez needs to have formal Eye exam before her appt.   Please call us back to let us know if she had that done or not. If not Alexis Gomez needs to reschedule her visit.

## 2020-07-08 ENCOUNTER — Encounter: Payer: Self-pay | Admitting: Neurology

## 2020-07-08 ENCOUNTER — Other Ambulatory Visit: Payer: Self-pay | Admitting: Neurology

## 2020-07-08 ENCOUNTER — Ambulatory Visit (INDEPENDENT_AMBULATORY_CARE_PROVIDER_SITE_OTHER): Payer: No Typology Code available for payment source | Admitting: Neurology

## 2020-07-08 ENCOUNTER — Other Ambulatory Visit: Payer: Self-pay

## 2020-07-08 VITALS — BP 138/95 | HR 98 | Ht 63.0 in | Wt 174.8 lb

## 2020-07-08 DIAGNOSIS — G932 Benign intracranial hypertension: Secondary | ICD-10-CM | POA: Diagnosis not present

## 2020-07-08 DIAGNOSIS — R519 Headache, unspecified: Secondary | ICD-10-CM

## 2020-07-08 MED ORDER — TIZANIDINE HCL 2 MG PO TABS: 2.0000 mg | ORAL_TABLET | Freq: Four times a day (QID) | ORAL | 5 refills | Status: DC | PRN

## 2020-07-08 MED ORDER — ACETAZOLAMIDE 250 MG PO TABS: 500.0000 mg | ORAL_TABLET | Freq: Two times a day (BID) | ORAL | 5 refills | Status: DC

## 2020-07-08 MED ORDER — TIZANIDINE HCL 2 MG PO TABS: 2.0000 mg | ORAL_TABLET | Freq: Four times a day (QID) | ORAL | 0 refills | Status: DC | PRN

## 2020-07-08 MED FILL — acetaZOLAMIDE 250 MG TABS: 250 | 30 days supply | Qty: 120 | Fill #0

## 2020-07-08 MED FILL — tiZANidine HCL 2 MG TABS: 2 | 22 days supply | Qty: 90 | Fill #0

## 2020-07-08 NOTE — Progress Notes (Signed)
NEUROLOGY FOLLOW UP OFFICE NOTE  Alexis Gomez [id number]  Assessment/Plan:   1.  New onset headache - may be cervicogenic, but given reported blurred vision and pulsatile tinnitus, need to consider flare up of IIH 2.  Idiopathic intracranial hypertension  1.  Will restart acetazolamide 500mg  twice daily.  Advised to follow up with Dr. to confirm papilledema. 2.  Tizanidine 2mg  for neck pain - advised to first take at bedtime.  May take every 6 hours but cautioned for drowsiness. 3.  Limit use of pain relievers to no more than 2 days out of week to prevent risk of rebound or medication-overuse headache. 4.  Follow up 4 to 6 months.  Subjective:  Alexis Gomez is a 35 year old female whofollows up for headache  UPDATE: Acetazolamide was discontinued in January.  She had a repeat eye exam with Dr. 38 on 07/02/2019 which again demonstrated no recurrence of papilledema.  She reports new headaches 2 weeks ago.  Starts back of her neck on the right and radiates up to the right eye.  It is a moderate pressure and pounding headache.  It lasts several hours until she takes something, it eases off and then will return 5-6 hours later.  Associated photophobia, some blurred vision and pulsatile tinnitus.  No nausea.  She has been treating with Tylenol 500 to 1000mg  daily for two weeks.  No radicular symptoms down the right arm.    CBC and CMP from December reviewed.  HISTORY: In August2020, she began experiencing double vision, described as vertical,occurs whether she is walking or sitting and watching TV. Symptoms are intermittent. They occur daily, lasting a minute or two off and on throughout the day.No visual obscurations but sometimes floaters. She notes hearing whooshing sounds in her ears. About one week prior to onset of double vision, she had a headache, moderate to severe bifrontal/temporal associated with nausea and vomiting. It subsequently resolved once the  double vision started. She denies history of headaches.  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 She was evaluated by ophthalmologist, Dr. January, on 12/02/18, who noted bilateral myopia, but also exam demonstrated bilateral papilledema. Visual fields were full.  Because she endorsed double vision, myasthenia panel was checked on 12/16/2018, which was negative. 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 Lumbar puncture from 01/12/2019 demonstrated elevated opening pressure of 29.5 cm H2O. CSF analysis was normal (including cell count, protein, glucose and culture). 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 She was started on acetazolamide 500mg  twice daily on 01/13/2019. She had a repeat eye exam by Dr. 03/11/2019 on 03/02/2019 which demonstrated resolution of papilledema. Patient also endorsed resolution of diplopia. Acetazolamide was decreased to 250mg  twice daily. Repeat eye exam with Dr. again demonstrated no papilledema.  Acetazolamide was discontinued in January.  No significant weight gain. She was taking Lo Loestrin Fe which was subsequently discontinued.  PAST MEDICAL HISTORY: Past Medical History:  Diagnosis Date  . Boil of buttock 04/28/2015  . History of abnormal cervical Pap smear 05/19/2013   Has had laser cone in past  . Intracranial hypertension   . Pseudotumor cerebri   . Vaginal Pap smear, abnormal     MEDICATIONS: Current Outpatient  Medications on File Prior to Visit  Medication Sig Dispense Refill  . celecoxib (CELEBREX) 200 MG capsule Take 1 capsule (200 mg total) by mouth 2 (two) times daily. 180 capsule 0  . naproxen (NAPROSYN) 500 MG tablet Take 1 tablet  (500 mg total) by mouth 2 (two) times daily with a meal. 30 tablet 3  . PARAGARD INTRAUTERINE COPPER IU by Intrauterine route.     No current facility-administered medications on file prior to visit.    ALLERGIES: No Known Allergies  FAMILY HISTORY: Family History  Problem Relation Age of Onset  . Seizures Father   . Heart disease Maternal Grandmother   . Diabetes Maternal Grandmother   . Stroke Maternal Grandmother   . Hypertension Maternal Grandmother   . Heart disease Maternal Grandfather   . Diabetes Maternal Grandfather   . Stroke Maternal Grandfather   . Heart disease Paternal Grandmother   . Diabetes Paternal Grandmother   . Heart disease Paternal Grandfather   . Diabetes Paternal Grandfather       Objective:  Blood pressure (!) 138/95, pulse 98, height 5\' 3"  (1.6 m), weight 174 lb 12.8 oz (79.3 kg), SpO2 100 %. General: No acute distress.  Patient appears well-groomed.   Head:  Normocephalic/atraumatic Eyes:  Fundi examined but not visualized Neck: supple, right paraspinal tenderness, full range of motion Heart:  Regular rate and rhythm Lungs:  Clear to auscultation bilaterally Back: No paraspinal tenderness Neurological Exam: alert and oriented to person, place, and time. Attention span and concentration intact, recent and remote memory intact, fund of knowledge intact.  Speech fluent and not dysarthric, language intact.  CN II-XII intact. Bulk and tone normal, muscle strength 5/5 throughout.  Sensation to light touch, temperature and vibration intact.  Deep tendon reflexes 2+ throughout, toes downgoing.  Finger to nose and heel to shin testing intact.  Gait normal, Romberg negative.     , DO  CC: Shon Millet, MD

## 2020-07-08 NOTE — Patient Instructions (Signed)
1.  Restart acetazolamide 500mg  twice daily 2.  For neck pain, tizanidine 2mg  every 6 hours (three times daily) as needed.  First only take at bedtime due to drowsiness.  If taken during the day, caution for drowsiness 3.  Follow up with Dr. for eye exam and have notes sent to me 4.  Follow up 4 to 6 months.

## 2020-07-08 NOTE — Addendum Note (Signed)
Addended by: Kandice Robinsons T on: 07/08/2020 02:22 PM   Modules accepted: Orders

## 2020-07-11 NOTE — Telephone Encounter (Signed)
I received today's office note from patient's ophthalmology appointment with Dr. Nile Riggs.  No evidence of papilledema on exam.  Therefore, I would like to stop acetazolamide and instead start topiramate.  Please send prescription for topiramate 25mg  at bedtime for one week, then increase to 50mg  at bedtime.  It is a medication similar to acetazolamide but better treats headache.  She should be aware that, like acetazolamide, numbness and tingling may be a side effect.  She should also stay hydrated and drink plenty of water (rarely may contribute to kidney stones).  Also should take precautions not to get pregnant.

## 2020-07-12 NOTE — Telephone Encounter (Signed)
No answer at 113 04/05/222

## 2020-07-13 NOTE — Telephone Encounter (Signed)
Tried calling pt, no answer. LMOVm to call the office back.

## 2020-07-14 ENCOUNTER — Other Ambulatory Visit (HOSPITAL_COMMUNITY): Payer: Self-pay

## 2020-07-14 MED ORDER — TOPIRAMATE 25 MG PO TABS
ORAL_TABLET | ORAL | 3 refills | Status: DC
  Filled 2020-07-14: qty 120, 60d supply, fill #0
  Filled 2020-10-11: qty 120, 60d supply, fill #1
  Filled 2021-01-16: qty 120, 60d supply, fill #2
  Filled 2021-04-06: qty 120, 60d supply, fill #3

## 2020-07-14 NOTE — Addendum Note (Signed)
Addended by: Leida Lauth on: 07/14/2020 11:52 AM   Modules accepted: Orders

## 2020-07-14 NOTE — Telephone Encounter (Signed)
Pt returned our call.  Topirmate 25 mg called into the [facility].

## 2020-07-15 ENCOUNTER — Other Ambulatory Visit (HOSPITAL_COMMUNITY): Payer: Self-pay

## 2020-09-14 NOTE — Progress Notes (Deleted)
Office Visit Note  Patient: Alexis Gomez             Date of Birth: 1986-02-22           MRN: [id number]             PCP: Assunta Found, MD Referring: Assunta Found, MD Visit Date: 09/28/2020 Occupation: @GUAROCC @  Subjective:  No chief complaint on file.   History of Present Illness: Alexis Gomez is a 35 y.o. female ***   Activities of Daily Living:  Patient reports morning stiffness for *** {minute/hour:19697}.   Patient {ACTIONS;DENIES/REPORTS:21021675::"Denies"} nocturnal pain.  Difficulty dressing/grooming: {ACTIONS;DENIES/REPORTS:21021675::"Denies"} Difficulty climbing stairs: {ACTIONS;DENIES/REPORTS:21021675::"Denies"} Difficulty getting out of chair: {ACTIONS;DENIES/REPORTS:21021675::"Denies"} Difficulty using hands for taps, buttons, cutlery, and/or writing: {ACTIONS;DENIES/REPORTS:21021675::"Denies"}  No Rheumatology ROS completed.   PMFS History:  Patient Active Problem List   Diagnosis Date Noted  . Encounter for IUD insertion 03/29/2020  . Complex cyst of right ovary 03/08/2020  . Mass of right ovary 01/26/2020  . Irregular bleeding 01/14/2020  . History of abnormal cervical Pap smear 05/19/2013    Past Medical History:  Diagnosis Date  . Boil of buttock 04/28/2015  . History of abnormal cervical Pap smear 05/19/2013   Has had laser cone in past  . Intracranial hypertension   . Pseudotumor cerebri   . Vaginal Pap smear, abnormal     Family History  Problem Relation Age of Onset  . Seizures Father   . Heart disease Maternal Grandmother   . Diabetes Maternal Grandmother   . Stroke Maternal Grandmother   . Hypertension Maternal Grandmother   . Heart disease Maternal Grandfather   . Diabetes Maternal Grandfather   . Stroke Maternal Grandfather   . Heart disease Paternal Grandmother   . Diabetes Paternal Grandmother   . Heart disease Paternal Grandfather   . Diabetes Paternal Grandfather    Past Surgical History:  Procedure Laterality Date  .  COLPOSCOPY    . laser cone    . 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 07/17/2013 daily; Exercise - not now      Right handed    Lives alone   Immunization History  Administered Date(s) Administered  . Influenza,inj,Quad PF,6+ Mos 01/22/2017  . Influenza-Unspecified 01/16/2013  . PFIZER(Purple Top)SARS-COV-2 Vaccination 11/27/2019, 12/18/2019     Objective: Vital Signs: There were no vitals taken for this visit.   Physical Exam   Musculoskeletal Exam: ***  CDAI Exam: CDAI Score: -- Patient Global: --; Provider Global: -- Swollen: --; Tender: -- Joint Exam 09/28/2020   No joint exam has been documented for this visit   There is currently no information documented on the homunculus. Go to the Rheumatology activity and complete the homunculus joint exam.  Investigation: No additional findings.  Imaging: No results found.  Recent Labs: Lab Results  Component Value Date   WBC 11.7 (H) 03/30/2020   HGB 13.3 03/30/2020   PLT 257 03/30/2020   NA 140 03/30/2020   K 4.5 03/30/2020   CL 103 03/30/2020   CO2 27 03/30/2020   GLUCOSE 83 03/30/2020   BUN 8 03/30/2020   CREATININE 0.58 03/30/2020   BILITOT 0.3 03/30/2020   ALKPHOS 47 11/22/2009   AST 17 03/30/2020   ALT 17 03/30/2020   PROT 7.3 03/30/2020   ALBUMIN 4.1 11/22/2009   CALCIUM  9.5 03/30/2020   GFRAA 141 03/30/2020   QFTBGOLDPLUS NEGATIVE 10/09/2019    Speciality Comments: No specialty comments available.  Procedures:  No procedures performed Allergies: Patient has no known allergies.   Assessment / Plan:     Visit Diagnoses: No diagnosis found.  Orders: No orders of the defined types were placed in this encounter.  No orders of the defined types were placed in this encounter.   Face-to-face time spent with patient was *** minutes. Greater than 50% of time was spent  in counseling and coordination of care.  Follow-Up Instructions: No follow-ups on file.   Ellen Henri, CMA  Note - This record has been created using Animal nutritionist.  Chart creation errors have been sought, but may not always  have been located. Such creation errors do not reflect on  the standard of medical care.

## 2020-09-28 ENCOUNTER — Ambulatory Visit: Payer: No Typology Code available for payment source | Admitting: Rheumatology

## 2020-10-11 ENCOUNTER — Other Ambulatory Visit (HOSPITAL_COMMUNITY): Payer: Self-pay

## 2020-10-11 MED FILL — Tizanidine HCl Tab 2 MG (Base Equivalent): ORAL | 22 days supply | Qty: 90 | Fill #0 | Status: AC

## 2020-12-30 NOTE — Progress Notes (Signed)
NEUROLOGY FOLLOW UP OFFICE NOTE  Alexis Gomez [id number]  Assessment/Plan:   Migraine without aura, without status migrainosus, not intractable History of idiopathic intracranial hypertension - stable Cervicalgia  Migraine prevention:  topiramate 50mg  at bedtime Migraine rescue: Tylenol Tizanidine as needed for neck pain Limit use of pain relievers to no more than 2 days out of week to prevent risk of rebound or medication-overuse headache. Keep headache diary Follow up 9 months.  Subjective:  Alexis Gomez is a 35 year old female who follows up for headache   UPDATE: Acetazolamide was discontinued in January 2022 but she had a recurrence of a daily headache in March 2022, described as moderate pressure and pounding headache from right posterior neck radiating to the right eye with photophobia, blurred vision and pulsatile tinnitus.  Acetazolamide was restarted on 07/08/2020 but repeat eye exam with Dr. 09/07/2020 on 07/11/2020 revealed no evidence of papilledema.  She was therefore switched from acetazolamide to topiramate. Over the past couple of months, the headaches have decreased.  They are moderate, up to 2 hours with Tylenol and occur every couple of weeks.  Working night shifts but headaches are controlled.  Maintains routine sleep schedule.    Current medications:  topiramate 50mg  at bedtime, tizanidine 2mg  (neck pain)   HISTORY: In August 2020, she began experiencing double vision, described as vertical, occurs whether she is walking or sitting and watching TV.  Symptoms are intermittent.  They occur daily, lasting a minute or two off and on throughout the day.  No visual obscurations but sometimes floaters.  She notes hearing whooshing sounds in her ears.  About one week prior to onset of double vision, she had a headache, moderate to severe bifrontal/temporal associated with nausea and vomiting.  It subsequently resolved once the double vision started.  She denies history of  headaches.   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 She was evaluated by ophthalmologist, Dr. , on 12/02/18, who noted bilateral myopia, but also exam demonstrated bilateral papilledema.  Visual fields were full.  Because she endorsed double vision, myasthenia panel was checked on 12/16/2018, which was negative. 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 Lumbar puncture from 01/12/2019 demonstrated elevated opening pressure of 29.5 cm H2O.  CSF analysis was normal (including cell count, protein, glucose and culture).  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  She was started on acetazolamide 500mg  twice daily on 01/13/2019.  She had a repeat eye exam by Dr. 03/11/2019 on 03/02/2019 which demonstrated resolution of papilledema.  Patient also endorsed resolution of diplopia.  Acetazolamide was decreased to 250mg  twice daily.  Repeat eye exam with Dr. again demonstrated no papilledema.     No significant weight gain.  She was taking Lo Loestrin Fe which was subsequently discontinued.  Past medications:  acetazolamide  PAST MEDICAL HISTORY: Past Medical History:  Diagnosis Date   Boil of buttock 04/28/2015   History of abnormal cervical Pap smear 05/19/2013   Has had Gomez cone in past   Intracranial hypertension    Pseudotumor cerebri    Vaginal Pap smear, abnormal     MEDICATIONS: Current Outpatient Medications on File Prior to Visit  Medication Sig Dispense Refill   acetaZOLAMIDE (  DIAMOX) 250 MG tablet TAKE 2 TABLETS (500 MG TOTAL) BY MOUTH 2 (TWO) TIMES DAILY. 120 tablet 5   amoxicillin (AMOXIL) 500 MG capsule TAKE 1 CAPSULE 3 TIMES DAILY UNTIL GONE. 21 capsule 0   celecoxib  (CELEBREX) 200 MG capsule TAKE 1 CAPSULE BY MOUTH TWICE DAILY 120 capsule 0   chlorhexidine (PERIDEX) 0.12 % solution RINSE MOUTH WITH (1 CAPFUL) FOR 30 SECONDS IN THE MORNING AND EVENING AFTER TOOTHBRUSHING. EXPECTORATE AFTER RINSING, DO NOT SWALLOW 473 mL 5   dexamethasone (DECADRON) 4 MG tablet TAKE 1 TABLET BY MOUTH 3 TIMES A DAY 9 tablet 0   ibuprofen (ADVIL) 400 MG tablet TAKE 1 TABLET EVERY 4 HOURS AS NEEDED FOR PAIN. NO MORE THAN 6 TABLETS PER DAY. 30 tablet 0   naproxen (NAPROSYN) 500 MG tablet Take 1 tablet (500 mg total) by mouth 2 (two) times daily with a meal. 30 tablet 3   PARAGARD INTRAUTERINE COPPER IU by Intrauterine route.     tiZANidine (ZANAFLEX) 2 MG tablet TAKE 1 TABLET BY MOUTH EVERY 6 HOURS AS NEEDED FOR MUSCLE SPASMS. 90 tablet 5   topiramate (TOPAMAX) 25 MG tablet Take 1 tablet by mouth at bedtime for one week, then increase to 2 tablets at bedtime 120 tablet 3   No current facility-administered medications on file prior to visit.    ALLERGIES: No Known Allergies  FAMILY HISTORY: Family History  Problem Relation Age of Onset   Seizures Father    Heart disease Maternal Grandmother    Diabetes Maternal Grandmother    Stroke Maternal Grandmother    Hypertension Maternal Grandmother    Heart disease Maternal Grandfather    Diabetes Maternal Grandfather    Stroke Maternal Grandfather    Heart disease Paternal Grandmother    Diabetes Paternal Grandmother    Heart disease Paternal Grandfather    Diabetes Paternal Grandfather       Objective:  Blood pressure 108/70, pulse 76, height 5\' 2"  (1.575 m), weight 178 lb 3.2 oz (80.8 kg). General: No acute distress.  Patient appears well-groomed.     , DO  CC: Alexis Millet, MD

## 2021-01-02 ENCOUNTER — Other Ambulatory Visit: Payer: Self-pay

## 2021-01-02 ENCOUNTER — Ambulatory Visit (INDEPENDENT_AMBULATORY_CARE_PROVIDER_SITE_OTHER): Payer: No Typology Code available for payment source | Admitting: Neurology

## 2021-01-02 ENCOUNTER — Encounter: Payer: Self-pay | Admitting: Neurology

## 2021-01-02 VITALS — BP 108/70 | HR 76 | Ht 62.0 in | Wt 178.2 lb

## 2021-01-02 DIAGNOSIS — G43009 Migraine without aura, not intractable, without status migrainosus: Secondary | ICD-10-CM | POA: Diagnosis not present

## 2021-01-02 DIAGNOSIS — M542 Cervicalgia: Secondary | ICD-10-CM

## 2021-01-02 DIAGNOSIS — G932 Benign intracranial hypertension: Secondary | ICD-10-CM

## 2021-01-02 NOTE — Patient Instructions (Signed)
Continue topiramate 50mg  at bedtime Continue tizanidine as needed as directed for neck pain Limit use of pain relievers to no more than 2 days out of week to prevent risk of rebound or medication-overuse headache. Follow up in 9 months.

## 2021-01-16 ENCOUNTER — Other Ambulatory Visit (HOSPITAL_COMMUNITY): Payer: Self-pay

## 2021-01-16 MED FILL — Tizanidine HCl Tab 2 MG (Base Equivalent): ORAL | 22 days supply | Qty: 90 | Fill #1 | Status: AC

## 2021-01-31 ENCOUNTER — Other Ambulatory Visit (HOSPITAL_COMMUNITY)
Admission: RE | Admit: 2021-01-31 | Discharge: 2021-01-31 | Disposition: A | Discharge status: Home / Self Care | Payer: No Typology Code available for payment source | Source: Ambulatory Visit | Attending: Adult Health | Admitting: Adult Health

## 2021-01-31 ENCOUNTER — Ambulatory Visit (INDEPENDENT_AMBULATORY_CARE_PROVIDER_SITE_OTHER): Payer: No Typology Code available for payment source | Admitting: Adult Health

## 2021-01-31 ENCOUNTER — Encounter: Payer: Self-pay | Admitting: Adult Health

## 2021-01-31 ENCOUNTER — Other Ambulatory Visit: Payer: Self-pay

## 2021-01-31 VITALS — BP 136/90 | HR 82 | Ht 63.0 in | Wt 166.0 lb

## 2021-01-31 DIAGNOSIS — Z01419 Encounter for gynecological examination (general) (routine) without abnormal findings: Secondary | ICD-10-CM | POA: Diagnosis not present

## 2021-01-31 DIAGNOSIS — Z113 Encounter for screening for infections with a predominantly sexual mode of transmission: Secondary | ICD-10-CM | POA: Insufficient documentation

## 2021-01-31 DIAGNOSIS — Z975 Presence of (intrauterine) contraceptive device: Secondary | ICD-10-CM | POA: Insufficient documentation

## 2021-01-31 DIAGNOSIS — N9489 Other specified conditions associated with female genital organs and menstrual cycle: Secondary | ICD-10-CM | POA: Insufficient documentation

## 2021-01-31 NOTE — Progress Notes (Signed)
Patient ID: Alexis Gomez, female   DOB: [birth year], 35 y.o.   MRN: [id number] History of Present Illness: Alexis Gomez is a 35 year old white female,single, G0P0, in for a well woman gyn exam. 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 Lab Results  Component Value Date   DIAGPAP  01/14/2020    - Negative for intraepithelial lesion or malignancy (NILM)   HPVHIGH Negative 01/14/2020   PCP is Dr Phillips Odor  Current Medications, Allergies, Past Medical History, Past Surgical History, Family History and Social History were reviewed in [employer] electronic medical record.     Review of Systems:  Patient denies any headaches, hearing loss, fatigue, blurred vision, shortness of breath, chest pain, problems with bowel movements, urination, or intercourse(not currently active). No joint pain or mood swings.  Has cramping, esp with period   Physical Exam:BP 136/90 (BP Location: Right Arm, Patient Position: Sitting, Cuff Size: Normal)   Pulse 82   Ht 5\' 3"  (1.6 m)   Wt 166 lb (75.3 kg)   LMP 01/06/2021   BMI 29.41 kg/m   General:  Well developed, well nourished, no acute distress Skin:  Warm and dry Neck:  Midline trachea, normal thyroid, good ROM, no lymphadenopathy Lungs; Clear to auscultation bilaterally Breast:  No dominant palpable mass, retraction, or nipple discharge Cardiovascular: Regular rate and rhythm Abdomen:  Soft, non tender, no hepatosplenomegaly Pelvic:  External genitalia is normal in appearance, no lesions.  The vagina is normal in appearance. Urethra has no lesions or masses. The cervix is nulliparous, +IUD strings at os, CV swab obtained.  Uterus is felt to be normal size, shape, and contour.  No adnexal masses or tenderness noted.Bladder is non tender, no masses felt. Extremities/musculoskeletal:  No swelling or varicosities noted, no clubbing or cyanosis Psych:  No mood changes, alert and cooperative,seems happy AA is 1 Fall risk is low Depression screen [facility] 2/9 01/31/2021 03/08/2020  01/14/2020  Decreased Interest 0 0 0  Down, Depressed, Hopeless 0 0 0  PHQ - 2 Score 0 0 0  Altered sleeping 1 - 1  Tired, decreased energy 1 - 1  Change in appetite - - 1  Feeling bad or failure about yourself  0 - 0  Trouble concentrating 0 - 0  Moving slowly or fidgety/restless 0 - 0  Suicidal thoughts 0 - 0  PHQ-9 Score 2 - 3     GAD 7 : Generalized Anxiety Score 01/31/2021 01/14/2020  Nervous, Anxious, on Edge 0 0  Control/stop worrying 0 0  Worry too much - different things 0 1  Trouble relaxing 0 0  Restless 0 0  Easily annoyed or irritable 0 0  Afraid - awful might happen 0 0  Total GAD 7 Score 0 1   .  Upstream - 01/31/21 02/02/21       Pregnancy Intention Screening   Does the patient want to become pregnant in the next year? No    Does the patient's partner want to become pregnant in the next year? No    Would the patient like to discuss contraceptive options today? No      Contraception Wrap Up   Current Method IUD or IUS    End Method IUD or IUS    Contraception Counseling Provided No             Examination chaperoned by [id number] LPN   Impression and Plan: 1. Encounter for well woman exam with routine gynecological exam Physical in 1 year Pap in  2024  2. IUD (intrauterine device) in place   3. Uterine cramping Try tylenol with advil   4. Screening examination for STD (sexually transmitted disease) CV swab sent for GC/CHL,trich and BV,yeast

## 2021-02-01 LAB — CERVICOVAGINAL ANCILLARY ONLY
Bacterial Vaginitis (gardnerella): NEGATIVE
Candida Glabrata: NEGATIVE
Candida Vaginitis: NEGATIVE
Chlamydia: NEGATIVE
Comment: NEGATIVE
Comment: NEGATIVE
Comment: NEGATIVE
Comment: NEGATIVE
Comment: NEGATIVE
Comment: NORMAL
Neisseria Gonorrhea: NEGATIVE
Trichomonas: NEGATIVE

## 2021-03-14 ENCOUNTER — Other Ambulatory Visit (HOSPITAL_COMMUNITY): Payer: Self-pay

## 2021-03-15 ENCOUNTER — Other Ambulatory Visit (HOSPITAL_COMMUNITY): Payer: Self-pay

## 2021-03-21 ENCOUNTER — Telehealth: Payer: No Typology Code available for payment source | Admitting: Physician Assistant

## 2021-03-21 DIAGNOSIS — R6889 Other general symptoms and signs: Secondary | ICD-10-CM

## 2021-03-22 MED ORDER — OSELTAMIVIR PHOSPHATE 75 MG PO CAPS
75.0000 mg | ORAL_CAPSULE | Freq: Two times a day (BID) | ORAL | 0 refills | Status: DC
Start: 2021-03-22 — End: 2021-07-11

## 2021-03-22 MED ORDER — BENZONATATE 100 MG PO CAPS
100.0000 mg | ORAL_CAPSULE | Freq: Three times a day (TID) | ORAL | 0 refills | Status: DC | PRN
Start: 2021-03-22 — End: 2021-07-11

## 2021-03-22 NOTE — Progress Notes (Signed)
E visit for Flu like symptoms   We are sorry that you are not feeling well.  Here is how we plan to help! Based on what you have shared with me it looks like you may have flu-like symptoms that should be watched but do not seem to indicate anti-viral treatment.  Influenza or the flu is   an infection caused by a respiratory virus. The flu virus is highly contagious and persons who did not receive their yearly flu vaccination may catch the flu from close contact.  We have anti-viral medications to treat the viruses that cause this infection. They are not a cure and only shorten the course of the infection. These prescriptions are most effective when they are given within the first 2 days of flu symptoms. Antiviral medication are indicated if you have a high risk of complications from the flu. You should  also consider an antiviral medication if you are in close contact with someone who is at risk. These medications can help patients avoid complications from the flu  but have side effects that you should know. Possible side effects from Tamiflu or oseltamivir include nausea, vomiting, diarrhea, dizziness, headaches, eye redness, sleep problems or other respiratory symptoms. You should not take Tamiflu if you have an allergy to oseltamivir or any to the ingredients in Tamiflu.  Based upon your symptoms and potential risk factors I recommend that you follow the flu symptoms recommendation that I have listed below.  I have also sent in a prescription cough medication called Tessalon for you to use as directed. Alternate tylenol and ibuprofen if needed for headache, body aches or chills. OTC Theraflu would be a good option.   ANYONE WHO HAS FLU SYMPTOMS SHOULD: Stay home. The flu is highly contagious and going out or to work exposes others! Be sure to drink plenty of fluids. Water is fine as well as fruit juices, sodas and electrolyte beverages. You may want to stay away from caffeine or alcohol.  If you are nauseated, try taking small sips of liquids. How do you know if you are getting enough fluid? Your urine should be a pale yellow or almost colorless. Get rest. Taking a steamy shower or using a humidifier may help nasal congestion and ease sore throat pain. Using a saline nasal spray works much the same way. Cough drops, hard candies and sore throat lozenges may ease your cough. Line up a caregiver. Have someone check on you regularly.   GET HELP RIGHT AWAY IF: You cannot keep down liquids or your medications. You become short of breath Your fell like you are going to pass out or loose consciousness. Your symptoms persist after you have completed your treatment plan MAKE SURE YOU  Understand these instructions. Will watch your condition. Will get help right away if you are not doing well or get worse.  Your e-visit answers were reviewed by a board certified advanced clinical practitioner to complete your personal care plan.  Depending on the condition, your plan could have included both over the counter or prescription medications.  If there is a problem please reply  once you have received a response from your provider.  Your safety is important to [place].  If you have drug allergies check your prescription carefully.    You can use MyChart to ask questions about todays visit, request a non-urgent call back, or ask for a work or school excuse for 24 hours related to this e-Visit. If it has been greater than 24  hours you will need to follow up with your provider, or enter a new e-Visit to address those concerns.  You will get an e-mail in the next two days asking about your experience.  I hope that your e-visit has been valuable and will speed your recovery. Thank you for using e-visits.

## 2021-03-22 NOTE — Addendum Note (Signed)
Addended by: Margaretann Loveless on: 03/22/2021 02:55 PM   Modules accepted: Orders

## 2021-03-22 NOTE — Progress Notes (Signed)
I have spent 5 minutes in review of e-visit questionnaire, review and updating patient chart, medical decision making and response to patient.   Pao Haffey Cody Dalani Mette, PA-C    

## 2021-04-06 ENCOUNTER — Other Ambulatory Visit (HOSPITAL_COMMUNITY): Payer: Self-pay

## 2021-04-06 MED FILL — Tizanidine HCl Tab 2 MG (Base Equivalent): ORAL | 22 days supply | Qty: 90 | Fill #2 | Status: AC

## 2021-04-07 ENCOUNTER — Other Ambulatory Visit (HOSPITAL_COMMUNITY): Payer: Self-pay

## 2021-05-01 ENCOUNTER — Other Ambulatory Visit (HOSPITAL_COMMUNITY): Payer: Self-pay

## 2021-05-01 MED ORDER — MOUNJARO 2.5 MG/0.5ML ~~LOC~~ SOAJ
2.5000 mg | SUBCUTANEOUS | 1 refills | Status: DC
  Filled 2021-05-21: qty 4, 56d supply, fill #0

## 2021-05-01 MED ORDER — MOUNJARO 2.5 MG/0.5ML ~~LOC~~ SOAJ
2.5000 mg | SUBCUTANEOUS | 0 refills | Status: DC
  Filled 2021-05-01: qty 2, 28d supply, fill #0

## 2021-05-22 ENCOUNTER — Other Ambulatory Visit (HOSPITAL_COMMUNITY): Payer: Self-pay

## 2021-05-23 ENCOUNTER — Other Ambulatory Visit (HOSPITAL_COMMUNITY): Payer: Self-pay

## 2021-07-11 ENCOUNTER — Ambulatory Visit (INDEPENDENT_AMBULATORY_CARE_PROVIDER_SITE_OTHER): Payer: No Typology Code available for payment source | Admitting: Adult Health

## 2021-07-11 ENCOUNTER — Encounter: Payer: Self-pay | Admitting: Adult Health

## 2021-07-11 VITALS — BP 135/88 | HR 108 | Ht 63.0 in | Wt 146.5 lb

## 2021-07-11 DIAGNOSIS — L02224 Furuncle of groin: Secondary | ICD-10-CM

## 2021-07-11 MED ORDER — SULFAMETHOXAZOLE-TRIMETHOPRIM 800-160 MG PO TABS: 1.0000 | ORAL_TABLET | Freq: Two times a day (BID) | ORAL | 0 refills | Status: DC

## 2021-07-11 MED ORDER — SILVER SULFADIAZINE 1 % EX CREA: 1.0000 "application " | TOPICAL_CREAM | Freq: Every day | CUTANEOUS | 0 refills | Status: DC

## 2021-07-11 NOTE — Progress Notes (Signed)
?  Subjective:  ?  ? Patient ID: Alexis Gomez, female   DOB: 29-Aug-1985, 36 y.o.   MRN: [id number] ? ?HPI ?Alexis Gomez is a 36 year old white female,single, G0P0, in complaining of ?boil left groin, has noticed about 3 days, since shaving. She tried Boil EZ. ?PCP is Dr Phillips Odor ?Lab Results  ?Component Value Date  ? DIAGPAP  01/14/2020  ?  - Negative for intraepithelial lesion or malignancy (NILM)  ? HPVHIGH Negative 01/14/2020  ?  ? ?Review of Systems ?Has ?boil left groin,came after shaving and then noticed 2 more  ?Has cramps with IUD ?  Reviewed past medical,surgical, social and family history. Reviewed medications and allergies.  ?Objective:  ? Physical Exam ?BP 135/88 (BP Location: Left Arm, Patient Position: Sitting, Cuff Size: Normal)   Pulse (!) 108   Ht 5\' 3"  (1.6 m)   Wt 146 lb 8 oz (66.5 kg)   LMP 07/05/2021   BMI 25.95 kg/m?   ?  Skin warm and dry.Pelvic: external genitalia is normal in appearance, has boil left Groin, red and tender,about 2-3 cms and has 2 smaller ones,one on inner buttock on right and one on left ?Fall risk I slow ? [facility] - 07/11/21 1216   ? ?  ? Pregnancy Intention Screening  ? Does the patient want to become pregnant in the next year? No   ? Does the patient's partner want to become pregnant in the next year? No   ? Would the patient like to discuss contraceptive options today? No   ?  ? Contraception Wrap Up  ? Current Method IUD or IUS   ? End Method IUD or IUS   ? ?  ?  ? ?  ? Examination chaperoned by 09/10/21 LPN  ? ?Assessment:  ?   ?1. Boil of groin ?Use warm compresses ?Do not squeeze ?Will rx septra ds and silvadene to calm it down  ?Meds ordered this encounter  ?Medications  ? sulfamethoxazole-trimethoprim (BACTRIM DS) 800-160 MG tablet  ?  Sig: Take 1 tablet by mouth 2 (two) times daily. Take 1 bid  ?  Dispense:  28 tablet  ?  Refill:  0  ?  Order Specific Question:   Supervising Provider  ?  Answer:   Malachy Mood H [[id number]]  ? silver sulfADIAZINE (SILVADENE) 1 %  cream  ?  Sig: Apply 1 application. topically daily.  ?  Dispense:  50 g  ?  Refill:  0  ?  Order Specific Question:   Supervising Provider  ?  Answer:   Duane Lope H [[id number]]  ?  ?  Do not shave ?Plan:  ?  If not better will I&D ?Will check for IUD strings 07/20/21,too. ?Follow up with me 07/20/21 ?   ?

## 2021-07-20 ENCOUNTER — Encounter: Payer: Self-pay | Admitting: Adult Health

## 2021-07-20 ENCOUNTER — Ambulatory Visit: Payer: No Typology Code available for payment source | Admitting: Adult Health

## 2021-07-20 VITALS — BP 126/87 | HR 96 | Ht 63.0 in | Wt 144.0 lb

## 2021-07-20 DIAGNOSIS — L02224 Furuncle of groin: Secondary | ICD-10-CM

## 2021-07-20 NOTE — Progress Notes (Signed)
?  Subjective:  ?  ? Patient ID: Alexis Gomez, female   DOB: [birth year], 36 y.o.   MRN: [id number] ? ?HPI ?Alexis Gomez is a 36 year old white female single, G0P0, back in follow up on taking septra ds and using silvadene for boils in peri area and is much better. They have resolved. ?PCP is Dr Phillips Odor ?Lab Results  ?Component Value Date  ? DIAGPAP  01/14/2020  ?  - Negative for intraepithelial lesion or malignancy (NILM)  ? HPVHIGH Negative 01/14/2020  ?  ? ?Review of Systems ?Feeling much better ?Boils resolved ?Reviewed past medical,surgical, social and family history. Reviewed medications and allergies.  ?   ?Objective:  ? Physical Exam ?BP 126/87 (BP Location: Right Arm, Patient Position: Sitting, Cuff Size: Normal)   Pulse 96   Ht 5\' 3"  (1.6 m)   Wt 144 lb (65.3 kg)   LMP 07/05/2021   BMI 25.51 kg/m?   ?  Skin warm and dry.Pelvic: external genitalia is normal in appearance, the smaller boils on right and left buttock have totally resolved and the one in left groin is smaller, less red and non tender.  ? [facility] - 07/20/21 1228   ? ?  ? Pregnancy Intention Screening  ? Does the patient's partner want to become pregnant in the next year? No   ? Would the patient like to discuss contraceptive options today? No   ?  ? Contraception Wrap Up  ? Current Method IUD or IUS   ? End Method IUD or IUS   ? Contraception Counseling Provided No   ? ?  ?  ? ?  ? Examination chaperoned by 07/22/21 LPN ?Assessment:  ?   ? 1. Boil of groin, resolving ?Finish septra ds and continue silvadene   ?   ?Plan:  ?   ?Follow up prn  ?   ?

## 2021-08-07 ENCOUNTER — Encounter: Payer: Self-pay | Admitting: Adult Health

## 2021-08-07 ENCOUNTER — Other Ambulatory Visit: Payer: Self-pay | Admitting: Neurology

## 2021-08-07 ENCOUNTER — Other Ambulatory Visit (HOSPITAL_COMMUNITY): Payer: Self-pay

## 2021-08-07 ENCOUNTER — Ambulatory Visit: Payer: No Typology Code available for payment source | Admitting: Adult Health

## 2021-08-07 VITALS — BP 143/89 | HR 90 | Ht 63.0 in | Wt 147.0 lb

## 2021-08-07 DIAGNOSIS — N9489 Other specified conditions associated with female genital organs and menstrual cycle: Secondary | ICD-10-CM | POA: Diagnosis not present

## 2021-08-07 DIAGNOSIS — Z3202 Encounter for pregnancy test, result negative: Secondary | ICD-10-CM | POA: Diagnosis not present

## 2021-08-07 DIAGNOSIS — G932 Benign intracranial hypertension: Secondary | ICD-10-CM

## 2021-08-07 DIAGNOSIS — Z30011 Encounter for initial prescription of contraceptive pills: Secondary | ICD-10-CM | POA: Diagnosis not present

## 2021-08-07 DIAGNOSIS — Z30432 Encounter for removal of intrauterine contraceptive device: Secondary | ICD-10-CM | POA: Diagnosis not present

## 2021-08-07 LAB — POCT URINE PREGNANCY: Preg Test, Ur: NEGATIVE

## 2021-08-07 MED ORDER — SLYND 4 MG PO TABS
1.0000 | ORAL_TABLET | Freq: Every day | ORAL | 12 refills | Status: DC
  Filled 2021-08-07: qty 28, 28d supply, fill #0
  Filled 2021-09-01: qty 28, 28d supply, fill #1
  Filled 2021-09-28: qty 28, 28d supply, fill #2
  Filled 2021-10-25: qty 28, 28d supply, fill #3
  Filled 2021-11-27: qty 28, 28d supply, fill #4
  Filled 2021-12-21: qty 28, 28d supply, fill #5
  Filled 2022-01-18: qty 28, 28d supply, fill #6
  Filled 2022-02-19: qty 28, 28d supply, fill #7
  Filled 2022-03-19: qty 28, 28d supply, fill #8
  Filled 2022-04-16: qty 28, 28d supply, fill #9
  Filled 2022-05-11: qty 28, 28d supply, fill #10
  Filled 2022-06-11: qty 28, 28d supply, fill #11
  Filled 2022-07-04: qty 28, 28d supply, fill #12

## 2021-08-07 MED ORDER — ONDANSETRON 4 MG PO TBDP
4.0000 mg | ORAL_TABLET | Freq: Four times a day (QID) | ORAL | 6 refills | Status: DC | PRN
  Filled 2021-08-07: qty 30, 4d supply, fill #0
  Filled 2022-06-17: qty 30, 4d supply, fill #1

## 2021-08-07 MED ORDER — TIZANIDINE HCL 2 MG PO CAPS
2.0000 mg | ORAL_CAPSULE | Freq: Every day | ORAL | 2 refills | Status: DC
  Filled 2021-08-07: qty 90, 90d supply, fill #0
  Filled 2021-12-21: qty 90, 90d supply, fill #1
  Filled 2022-02-27 – 2022-03-19 (×2): qty 90, 90d supply, fill #2

## 2021-08-07 MED ORDER — TOPIRAMATE 25 MG PO TABS
50.0000 mg | ORAL_TABLET | Freq: Every day | ORAL | 2 refills | Status: DC
  Filled 2021-08-07: qty 180, 90d supply, fill #0

## 2021-08-07 MED ORDER — SUMATRIPTAN SUCCINATE 50 MG PO TABS
50.0000 mg | ORAL_TABLET | ORAL | 6 refills | Status: DC | PRN
  Filled 2021-08-07: qty 18, 30d supply, fill #0

## 2021-08-07 NOTE — Progress Notes (Signed)
?  Subjective:  ?  ? Patient ID: Alexis Gomez, female   DOB: [birth year], 36 y.o.   MRN: [id number] ? ?HPI ?Alexis Gomez is a 36 year old white female,single, G0P0, in requesting IUD removal for cramping and wants to start a pill. 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 ? ?Lab Results  ?Component Value Date  ? DIAGPAP  01/14/2020  ?  - Negative for intraepithelial lesion or malignancy (NILM)  ? HPVHIGH Negative 01/14/2020  ? PCP is Dr Phillips Odor. ? ?Review of Systems ?+uterine cramping ?Not currently sexually active ?Reviewed past medical,surgical, social and family history. Reviewed medications and allergies.  ?   ?Objective:  ? Physical Exam ?BP (!) 143/89 (BP Location: Left Arm, Patient Position: Sitting, Cuff Size: Normal)   Pulse 90   Ht 5\' 3"  (1.6 m)   Wt 147 lb (66.7 kg)   LMP 07/30/2021   BMI 26.04 kg/m?  UPT is negative. Consent signed and time out called. ?Skin warm and dry.Pelvic: external genitalia is normal in appearance no lesions, vagina is pink and moist,urethra has no lesions or masses noted, cervix:nulliparous, +IUD strings seen, grasped with forceps and asked to cough and IUD easily removed, uterus: normal size, shape and contour, non tender, no masses felt, adnexa: no masses or tenderness noted. Bladder is non tender and no masses felt ?Fall risk is low ? [facility] - 08/07/21 0941   ? ?  ? Pregnancy Intention Screening  ? Does the patient want to become pregnant in the next year? No   ? Does the patient's partner want to become pregnant in the next year? No   ? Would the patient like to discuss contraceptive options today? Yes   ?  ? Contraception Wrap Up  ? Current Method IUD or IUS   ? End Method Oral Contraceptive   ? ?  ?  ? ?  ?  ?  Examination chaperoned by 10/07/21 LPN ? ?Assessment:  ?   ?1. Pregnancy examination or test, negative result ? ? ?2. Encounter for IUD removal ?IUD easily  removed ? ?3. Encounter for initial prescription of contraceptive pills ?Will prescribe POP, discussed with Dr Malachy Mood ?Rx sent for slynd, can start now and use condoms ?Meds ordered this encounter  ?Medications  ? Drospirenone (SLYND) 4 MG TABS  ?  Sig: Take 1 tablet by mouth daily.  ?  Dispense:  28 tablet  ?  Refill:  12  ?  Order Specific Question:   Supervising Provider  ?  Answer:   Charlotta Newton H [[id number]]  ?  ? ?4. Uterine cramping ? ?   ?Plan:  ?   ?Follow up in 3 months for ROS  ?   ?

## 2021-09-01 ENCOUNTER — Other Ambulatory Visit (HOSPITAL_COMMUNITY): Payer: Self-pay

## 2021-09-28 ENCOUNTER — Other Ambulatory Visit (HOSPITAL_COMMUNITY): Payer: Self-pay

## 2021-10-03 ENCOUNTER — Encounter: Payer: Self-pay | Admitting: Neurology

## 2021-10-03 ENCOUNTER — Other Ambulatory Visit (HOSPITAL_COMMUNITY): Payer: Self-pay

## 2021-10-03 ENCOUNTER — Ambulatory Visit: Payer: No Typology Code available for payment source | Admitting: Neurology

## 2021-10-03 VITALS — BP 146/91 | HR 89 | Ht 63.0 in | Wt 150.0 lb

## 2021-10-03 DIAGNOSIS — G43009 Migraine without aura, not intractable, without status migrainosus: Secondary | ICD-10-CM

## 2021-10-03 MED ORDER — TOPIRAMATE 25 MG PO TABS
75.0000 mg | ORAL_TABLET | Freq: Every day | ORAL | 5 refills | Status: DC
  Filled 2021-10-03: qty 90, 30d supply, fill #0
  Filled 2021-11-27: qty 90, 30d supply, fill #1
  Filled 2021-12-21: qty 90, 30d supply, fill #2
  Filled 2022-02-02: qty 90, 30d supply, fill #3
  Filled 2022-02-28: qty 90, 30d supply, fill #4

## 2021-10-03 MED ORDER — RIZATRIPTAN BENZOATE 10 MG PO TBDP
10.0000 mg | ORAL_TABLET | ORAL | 5 refills | Status: DC | PRN
  Filled 2021-10-03: qty 9, 30d supply, fill #0

## 2021-10-05 ENCOUNTER — Other Ambulatory Visit (HOSPITAL_COMMUNITY): Payer: Self-pay

## 2021-10-18 ENCOUNTER — Other Ambulatory Visit: Payer: Self-pay | Admitting: Adult Health

## 2021-10-18 ENCOUNTER — Other Ambulatory Visit (HOSPITAL_COMMUNITY): Payer: Self-pay

## 2021-10-18 MED ORDER — SULFAMETHOXAZOLE-TRIMETHOPRIM 800-160 MG PO TABS
1.0000 | ORAL_TABLET | Freq: Two times a day (BID) | ORAL | 0 refills | Status: DC
  Filled 2021-10-18: qty 28, 14d supply, fill #0

## 2021-10-18 NOTE — Progress Notes (Signed)
Will rx septra ds  

## 2021-10-25 ENCOUNTER — Other Ambulatory Visit (HOSPITAL_COMMUNITY): Payer: Self-pay

## 2021-11-14 ENCOUNTER — Other Ambulatory Visit (HOSPITAL_COMMUNITY): Payer: Self-pay

## 2021-11-14 ENCOUNTER — Ambulatory Visit: Payer: No Typology Code available for payment source | Admitting: Adult Health

## 2021-11-14 ENCOUNTER — Encounter: Payer: Self-pay | Admitting: Adult Health

## 2021-11-14 VITALS — BP 126/83 | HR 89 | Ht 63.0 in | Wt 147.0 lb

## 2021-11-14 DIAGNOSIS — N9489 Other specified conditions associated with female genital organs and menstrual cycle: Secondary | ICD-10-CM

## 2021-11-14 DIAGNOSIS — Z3041 Encounter for surveillance of contraceptive pills: Secondary | ICD-10-CM | POA: Insufficient documentation

## 2021-11-14 DIAGNOSIS — L739 Follicular disorder, unspecified: Secondary | ICD-10-CM | POA: Diagnosis not present

## 2021-11-14 MED ORDER — MUPIROCIN CALCIUM 2 % EX CREA: 1.0000 | TOPICAL_CREAM | Freq: Two times a day (BID) | CUTANEOUS | 1 refills | Status: DC

## 2021-11-14 MED ORDER — MUPIROCIN CALCIUM 2 % EX CREA
1.0000 | TOPICAL_CREAM | Freq: Two times a day (BID) | CUTANEOUS | 1 refills | Status: DC
  Filled 2021-11-14: qty 15, 8d supply, fill #0

## 2021-11-14 NOTE — Progress Notes (Signed)
  Subjective:     Patient ID: Alexis Gomez, female   DOB: 1986-02-27, 36 y.o.   MRN: [id number]  HPI Alexis Gomez is a 36 year old white female,single, G0P0, in for follow up on cramping after having IUD removed and starting Slynd.  Lab Results  Component Value Date   DIAGPAP  01/14/2020    - Negative for intraepithelial lesion or malignancy (NILM)   HPVHIGH Negative 01/14/2020   PCP is Dr Phillips Odor  Review of Systems Cramping has resolved Periods light  2 hair bumps Reviewed past medical,surgical, social and family history. Reviewed medications and allergies.     Objective:   Physical Exam BP 126/83 (BP Location: Right Arm, Patient Position: Sitting, Cuff Size: Normal)   Pulse 89   Ht 5\' 3"  (1.6 m)   Wt 147 lb (66.7 kg)   BMI 26.04 kg/m     Skin warm and dry. Lungs: clear to ausculation bilaterally. Cardiovascular: regular rate and rhythm.  Has 2 hair bumps in groin area, no exam.  [facility] - 11/14/21 1603       Pregnancy Intention Screening   Does the patient want to become pregnant in the next year? No    Does the patient's partner want to become pregnant in the next year? No    Would the patient like to discuss contraceptive options today? No      Contraception Wrap Up   Current Method Oral Contraceptive    End Method Oral Contraceptive    Contraception Counseling Provided No             Assessment:     1. Encounter for surveillance of contraceptive pills Continue Slynd has refills   2. Uterine cramping, has resolved since IUD removed and taking Slynd  3. Folliculitis No shaving Use antibacterial soap Will try Bactroban Meds ordered this encounter  Medications   DISCONTD: mupirocin cream (BACTROBAN) 2 %    Sig: Apply 1 Application topically 2 (two) times daily.    Dispense:  15 g    Refill:  1    Order Specific Question:   Supervising Provider    Answer:   01/14/22, LUTHER H [[id number]]   mupirocin cream (BACTROBAN) 2 %    Sig: Apply 1 Application topically 2  (two) times daily.    Dispense:  15 g    Refill:  1    Order Specific Question:   Supervising Provider    Answer:   Despina Hidden [[id number]]       Plan:     Follow up prn

## 2021-11-27 ENCOUNTER — Other Ambulatory Visit (HOSPITAL_COMMUNITY): Payer: Self-pay

## 2021-12-21 ENCOUNTER — Other Ambulatory Visit (HOSPITAL_COMMUNITY): Payer: Self-pay

## 2022-01-15 IMAGING — DX DG CHEST 2V
2 series · 2 of 2 positions shown · non-contrast
Comparison: None.

CLINICAL DATA: Cigarette smoker.  Cough.

EXAM:
CHEST - 2 VIEW

[chest pa]
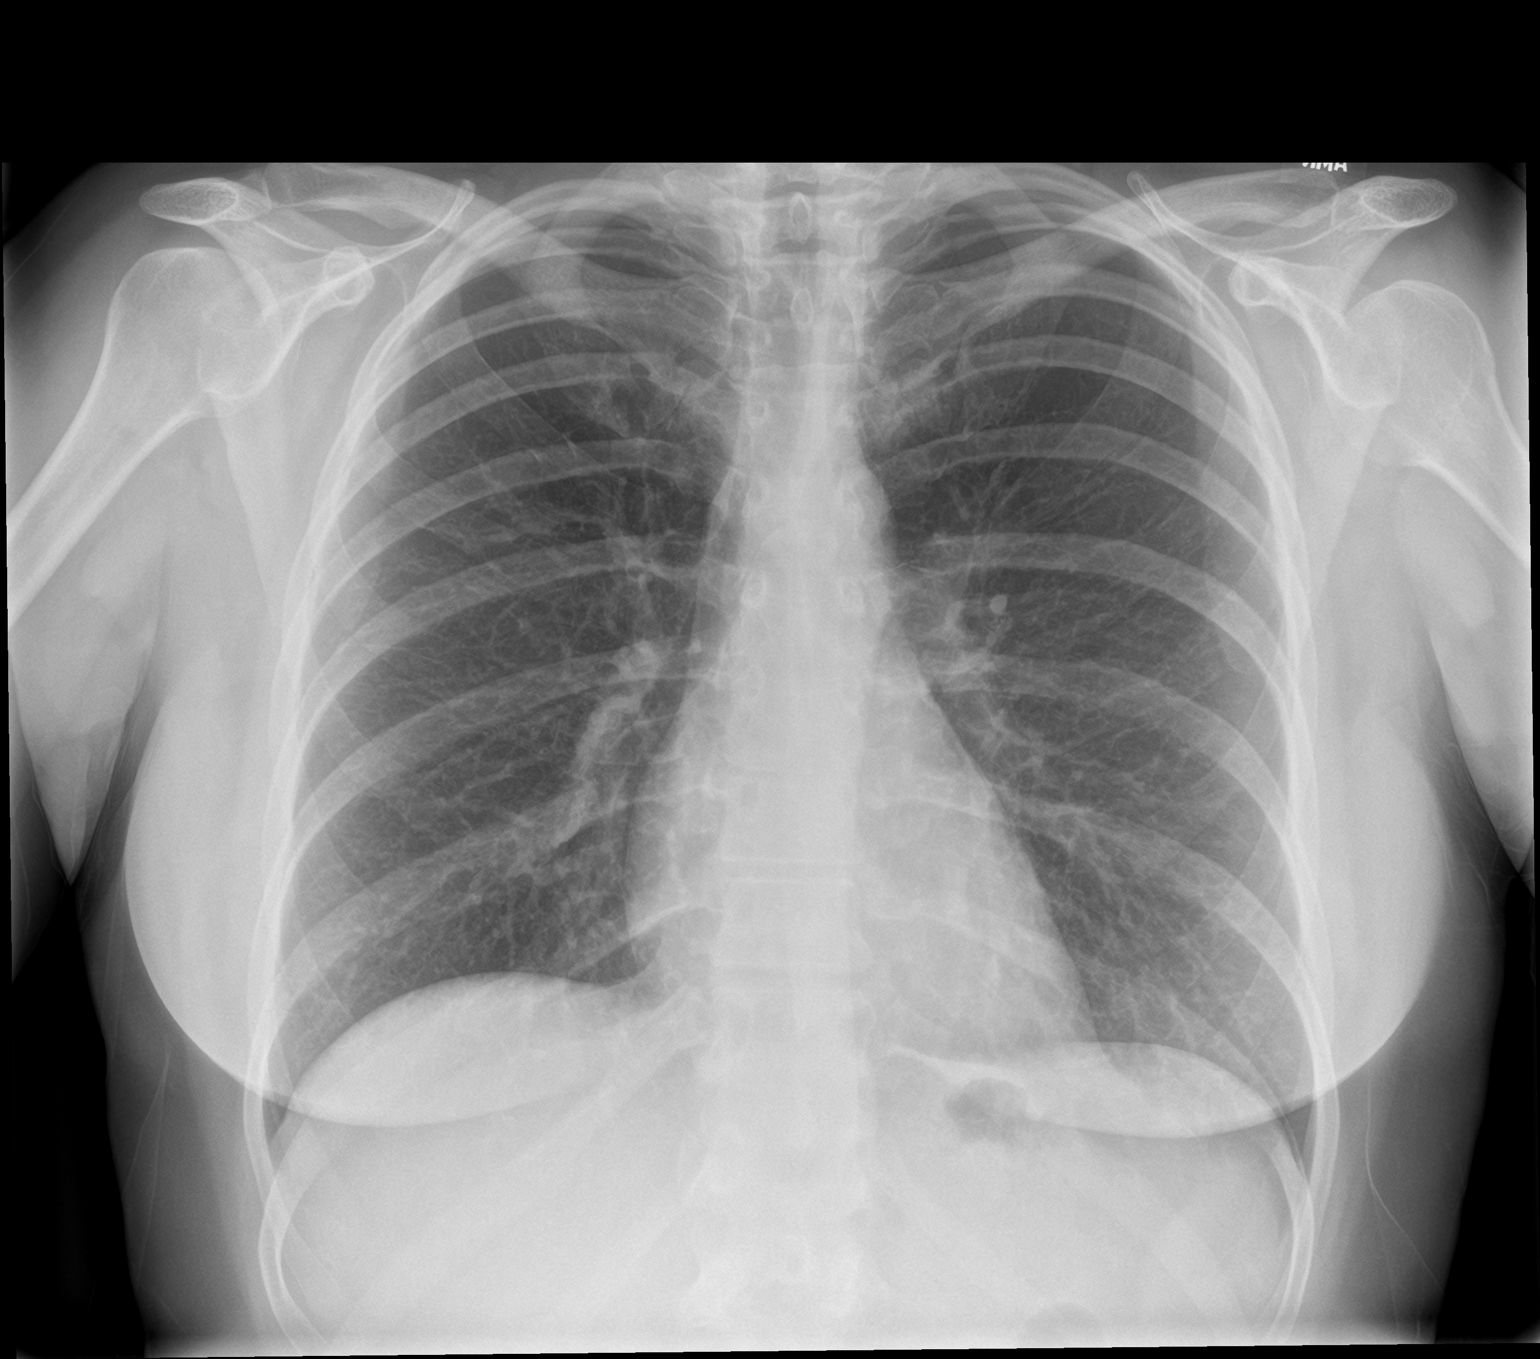

[chest lat]
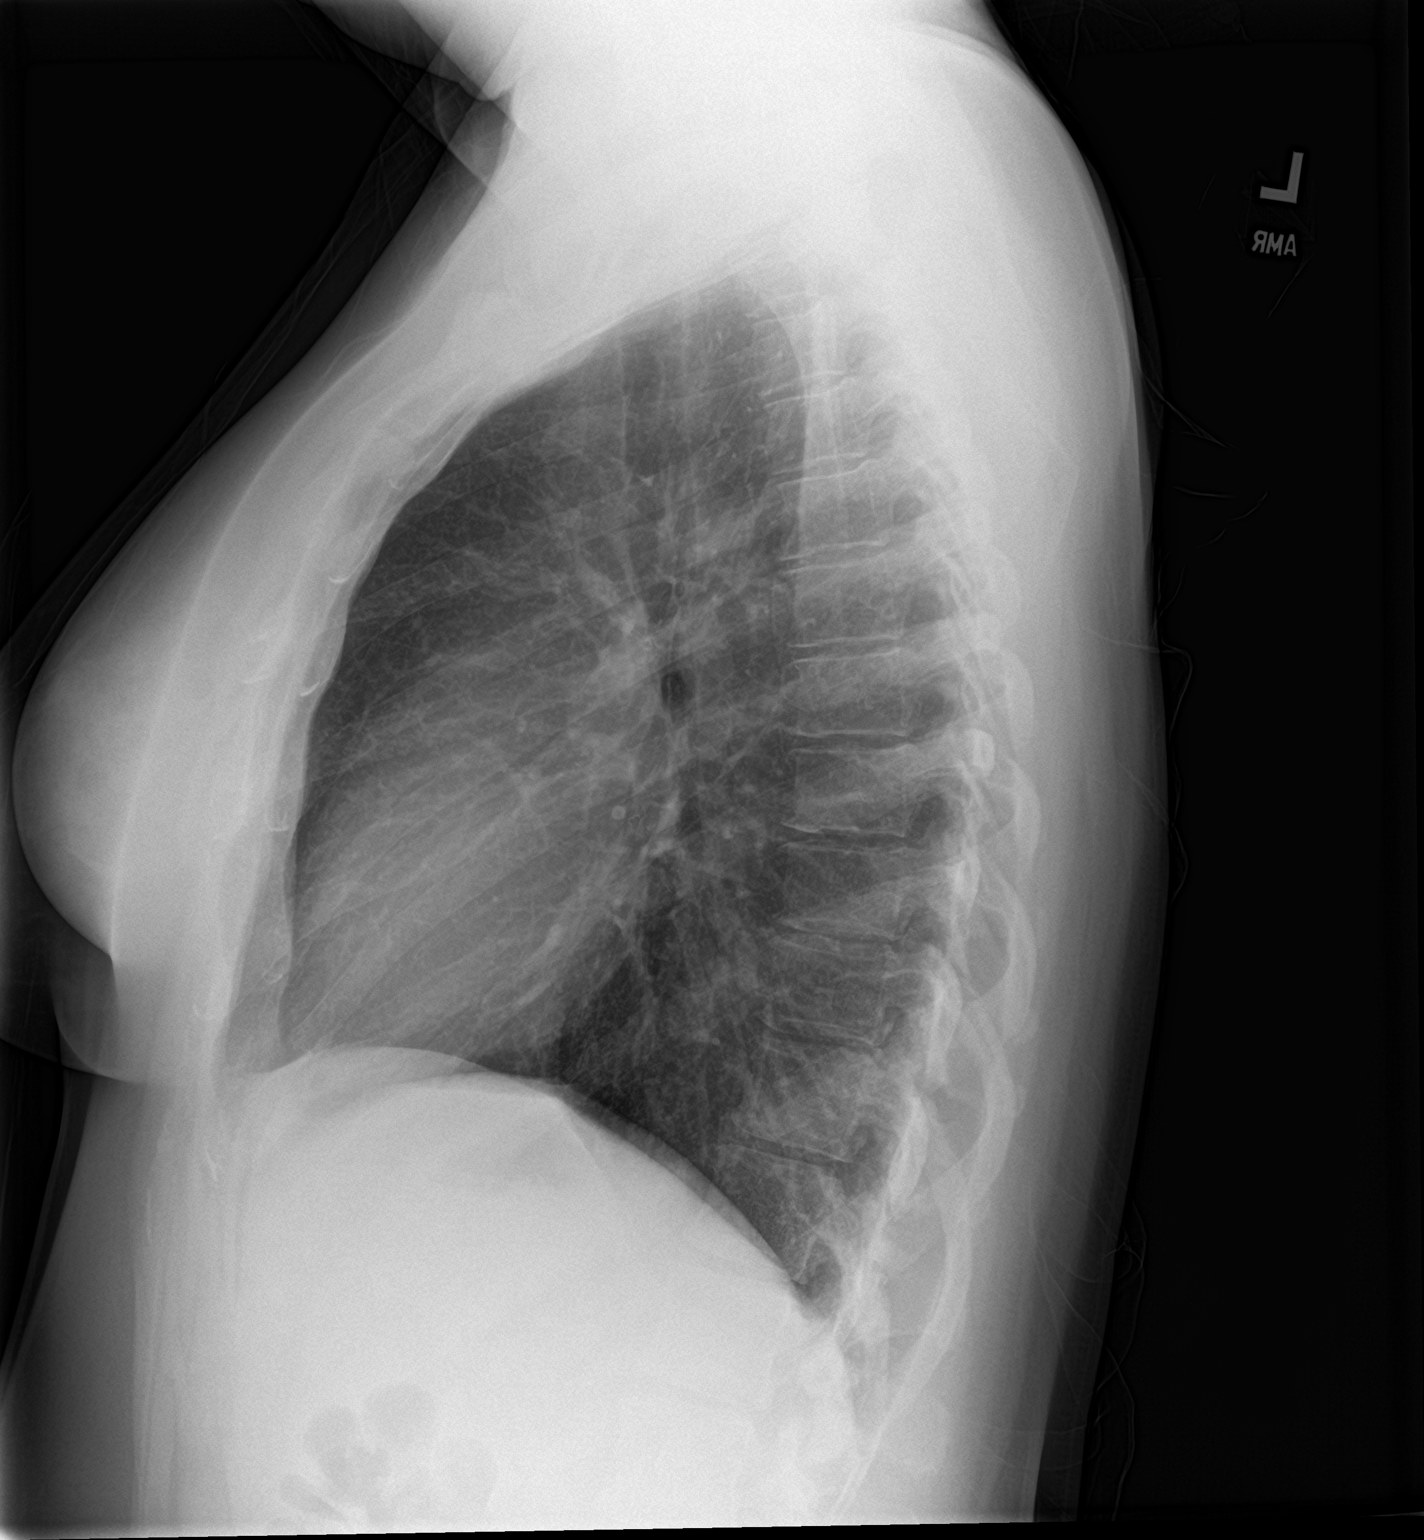

[2 of 2 positions shown; findings below may reference images not displayed]

FINDINGS: The lungs are clear. Heart size is normal. No pneumothorax or
pleural fluid. No acute or focal bony abnormality.
IMPRESSION: No acute disease.

## 2022-01-18 ENCOUNTER — Other Ambulatory Visit (HOSPITAL_COMMUNITY): Payer: Self-pay

## 2022-01-30 IMAGING — MR MR ANKLE*L* W/O CM
5 series · 40 of 40 positions shown · non-contrast
Comparison: Radiographs 10/09/2019

CLINICAL DATA: Palpable erythematous knot in the left ankle 2
months ago, subsequently improved. Persistent ankle pain and
discomfort.

EXAM:
MRI OF THE LEFT ANKLE WITHOUT CONTRAST
TECHNIQUE: Multiplanar, multisequence MR imaging of the ankle was performed. No
intravenous contrast was administered.

[Series 3: T2 fat-sat · axial · left · 4.0mm · 0.31mm/px · z∈[-52,+98]mm · 10 of 31 slices shown (1 of 2)]
[im 1/31]
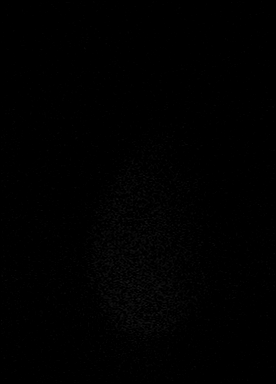
[im 4/31]
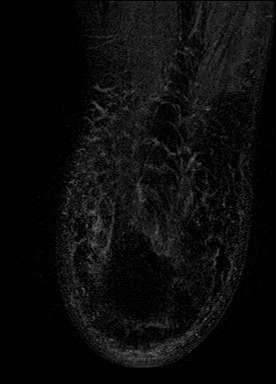
[im 7/31]
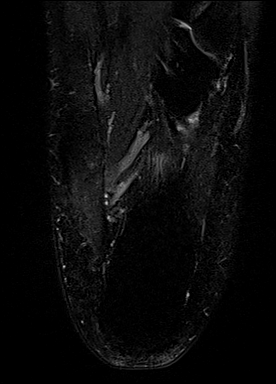
[im 11/31]
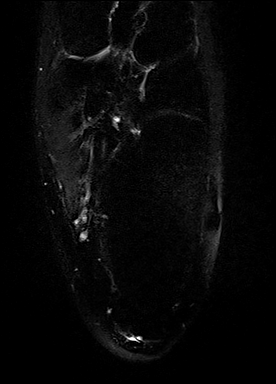
[im 14/31]
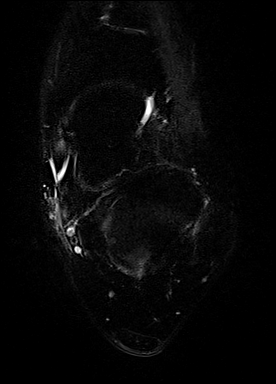
[im 17/31]
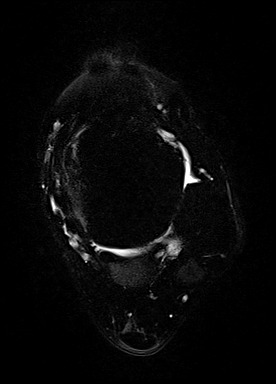
[im 21/31]
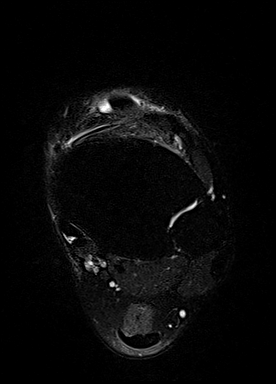
[im 24/31]
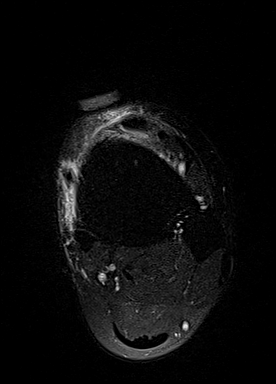
[im 27/31]
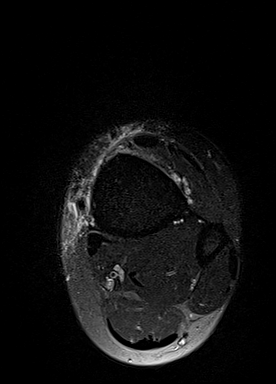
[im 31/31]
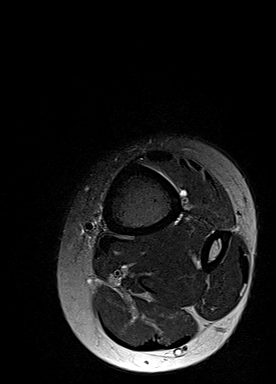

[Series 4: PD fat-sat · axial · left · 4.0mm · 0.38mm/px · z∈[-52,+98]mm · 9 of 31 slices shown]
[im 1/31]
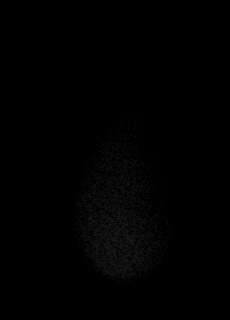
[im 4/31]
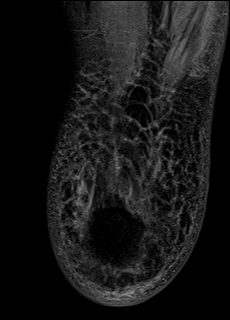
[im 8/31]
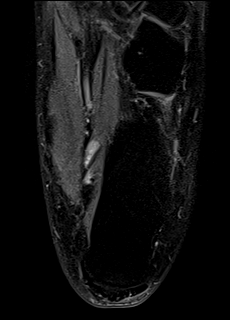
[im 12/31]
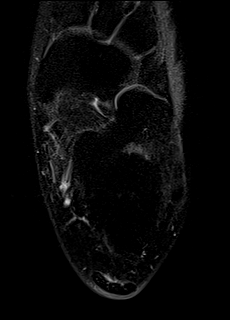
[im 16/31]
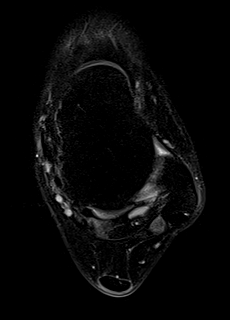
[im 19/31]
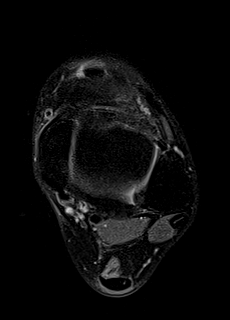
[im 23/31]
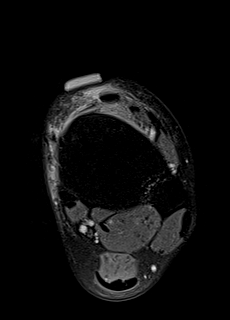
[im 27/31]
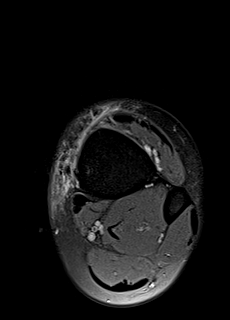
[im 31/31]
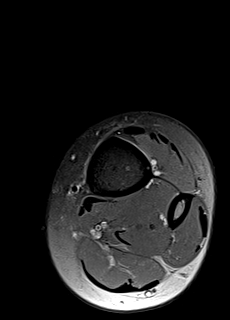

[Series 5: T2 fat-sat · coronal · left · 3.0mm · 0.44mm/px · 9 of 32 slices shown (2 of 2)]
[im 1/32]
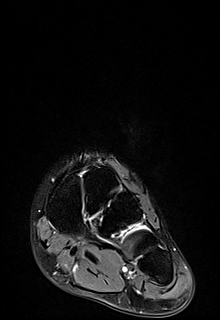
[im 4/32]
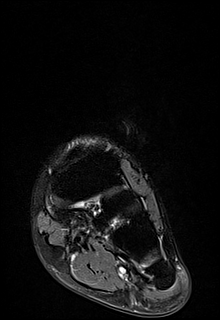
[im 8/32]
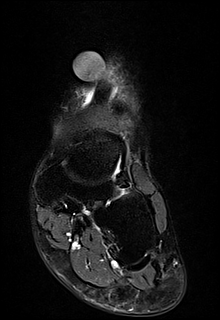
[im 12/32]
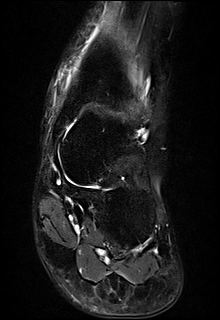
[im 16/32]
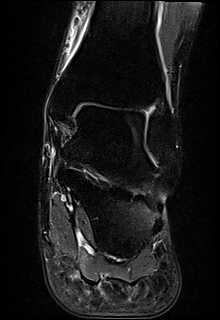
[im 20/32]
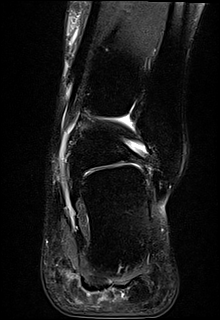
[im 24/32]
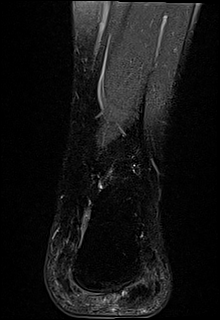
[im 28/32]
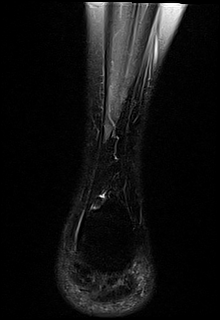
[im 32/32]
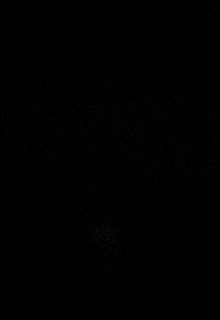

[Series 6: T1 · sagittal · left · 3.0mm · 0.44mm/px · 6 of 21 slices shown]
[im 1/21]
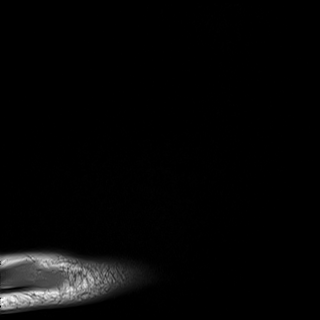
[im 5/21]
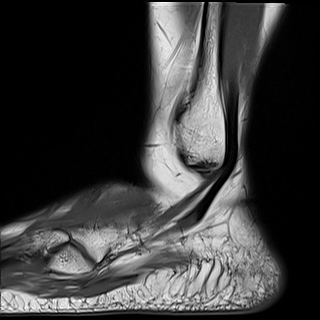
[im 9/21]
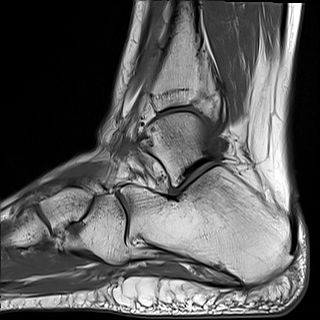
[im 13/21]
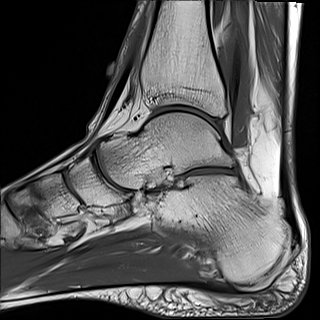
[im 17/21]
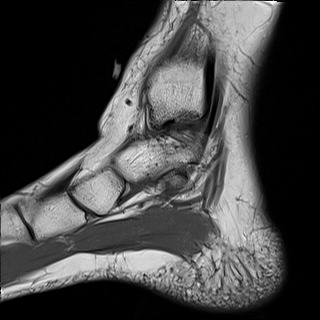
[im 21/21]
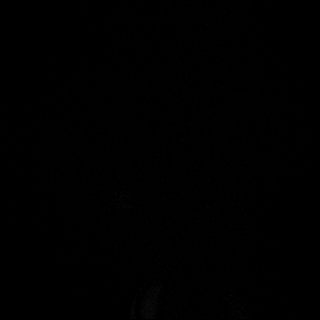

[Series 7: STIR · sagittal · left · 3.0mm · 0.27mm/px · 6 of 21 slices shown]
[im 1/21]
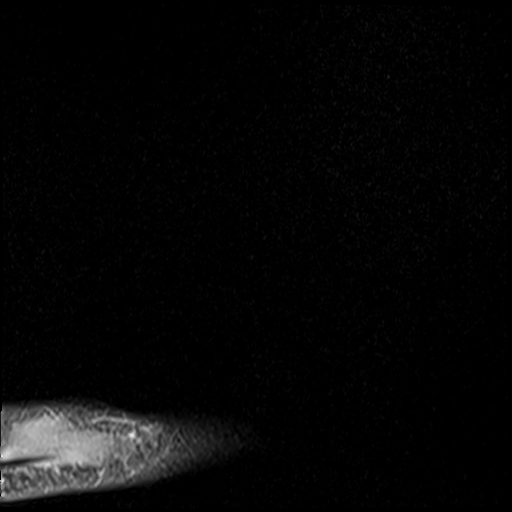
[im 5/21]
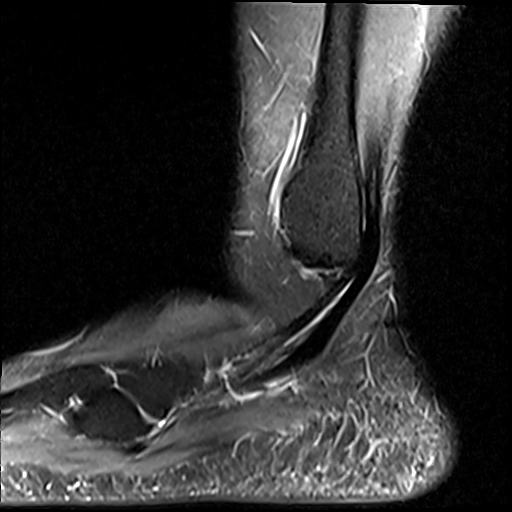
[im 9/21]
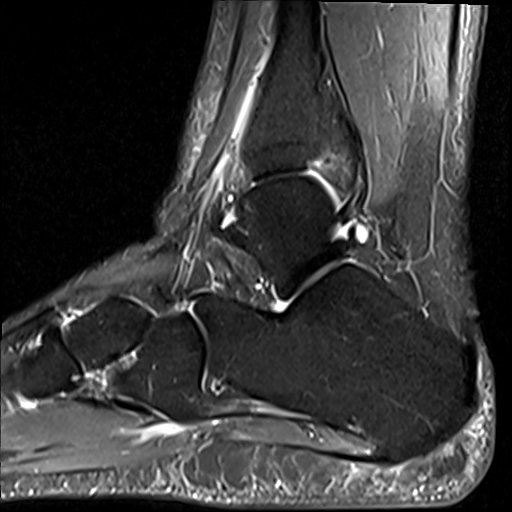
[im 13/21]
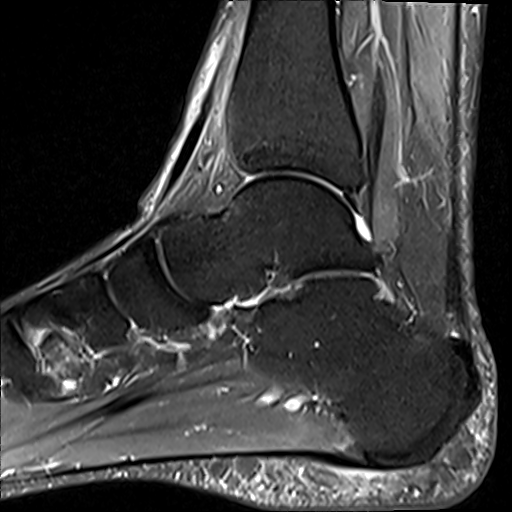
[im 17/21]
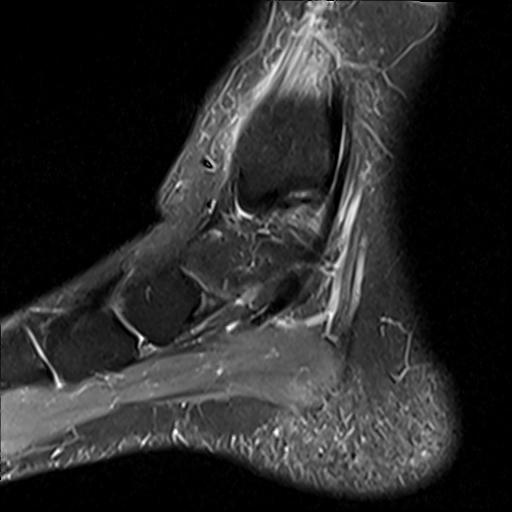
[im 21/21]
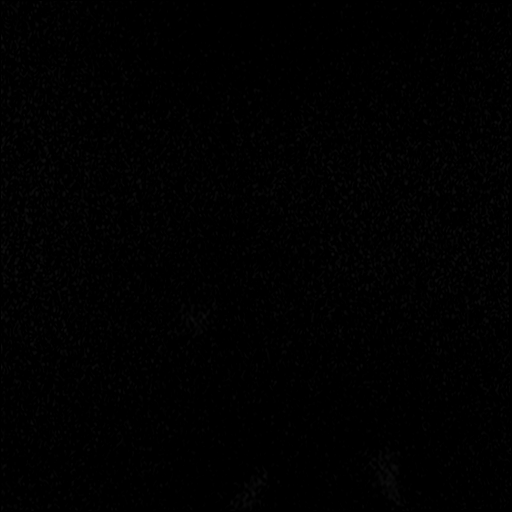

[40 of 40 positions shown; findings below may reference images not displayed]

FINDINGS: TENDONS

Peroneal: Intact and normally positioned.

Posteromedial: Intact and normally positioned. A small amount of
fluid in the posterior tibialis tendon sheath is within physiologic
limits.

Anterior: A capsule was placed anteriorly over the palpable concern.
This lies just medial to the tibialis anterior tendon which
demonstrates a small amount of sheath fluid and surrounding soft
tissue edema. There is no tendon tear. The additional anterior
extensor tendons appear normal.

Achilles: Intact.

Plantar Fascia: Intact.

LIGAMENTS

Lateral: The anterior and posterior talofibular and calcaneofibular
ligaments are intact.

Medial: The deltoid and visualized portions of the spring ligament
appear intact.

CARTILAGE AND BONES

Ankle Joint: No significant ankle joint effusion. The talar dome and
tibial plafond are intact.

Subtalar Joints/Sinus Tarsi: Unremarkable.

Bones: No significant extra-articular osseous findings.

Other: As above, there is anterior subcutaneous edema which extends
medially along the distal tibia. There is a small amount of fluid
within the anterior tibialis tendon sheath which may indicate
tenosynovitis. No focal fluid collection, mass or foreign body
identified.
IMPRESSION: 1. Anterior subcutaneous edema which extends medially along the
distal tibia. No focal fluid collection, mass or foreign body
identified. This could indicate cellulitis.
2. Small amount of fluid within the anterior tibialis tendon sheath
which may indicate tenosynovitis and contribute to the patient's
symptoms.
3. The additional ankle tendons and ligaments appear normal.
4. No acute osseous findings or significant arthropathic changes.

## 2022-02-02 ENCOUNTER — Other Ambulatory Visit (HOSPITAL_COMMUNITY): Payer: Self-pay

## 2022-02-06 ENCOUNTER — Other Ambulatory Visit: Payer: Self-pay

## 2022-02-06 ENCOUNTER — Ambulatory Visit
Admission: EM | Admit: 2022-02-06 | Discharge: 2022-02-06 | Disposition: A | Discharge status: Home / Self Care | Payer: No Typology Code available for payment source | Attending: Nurse Practitioner | Admitting: Nurse Practitioner

## 2022-02-06 ENCOUNTER — Encounter: Payer: Self-pay | Admitting: Emergency Medicine

## 2022-02-06 DIAGNOSIS — J069 Acute upper respiratory infection, unspecified: Secondary | ICD-10-CM | POA: Diagnosis not present

## 2022-02-06 DIAGNOSIS — Z1152 Encounter for screening for COVID-19: Secondary | ICD-10-CM | POA: Insufficient documentation

## 2022-02-06 LAB — RESP PANEL BY RT-PCR (FLU A&B, COVID) ARPGX2
Influenza A by PCR: NEGATIVE
Influenza B by PCR: NEGATIVE
SARS Coronavirus 2 by RT PCR: NEGATIVE

## 2022-02-06 MED ORDER — BENZONATATE 100 MG PO CAPS: 100.0000 mg | ORAL_CAPSULE | Freq: Three times a day (TID) | ORAL | 0 refills | Status: DC | PRN

## 2022-02-06 MED ORDER — PROMETHAZINE-DM 6.25-15 MG/5ML PO SYRP: 5.0000 mL | ORAL_SOLUTION | Freq: Every evening | ORAL | 0 refills | Status: DC | PRN

## 2022-02-06 NOTE — ED Provider Notes (Signed)
[facility]    CSN: [id number] Arrival date & time: 02/06/22  1209      History   Chief Complaint Chief Complaint  Patient presents with   Cough    HPI CARLOTTA ETCHISON is a 36 y.o. female.   Patient presents with 4 to 5 days of body aches, productive/congested cough, shortness of breath with activity, chest tightness, chest and nasal congestion, runny nose, postnasal drainage, sneezing, sinus pressure, headache, left-sided jaw pain, and fatigue.  She denies chest pain, sore throat, ear pain, abdominal pain, nausea/vomiting, diarrhea, decreased appetite, loss of taste or smell, and new rash.  Reports she has a history of IBS and symptoms are currently at her baseline; had diarrhea yesterday without blood.  Has been taking Flonase, Tylenol severe sinus which has helped with some of her symptoms.  Reports overall, she is feeling worse than when her symptoms first started.    Past Medical History:  Diagnosis Date   Boil of buttock 04/28/2015   History of abnormal cervical Pap smear 05/19/2013   Has had laser cone in past   Intracranial hypertension    Pseudotumor cerebri    Vaginal Pap smear, abnormal     Patient Active Problem List   Diagnosis Date Noted   Encounter for surveillance of contraceptive pills 123XX123   Folliculitis 123XX123   Encounter for initial prescription of contraceptive pills 08/07/2021   Encounter for IUD removal 08/07/2021   Pregnancy examination or test, negative result 08/07/2021   Boil of groin 07/20/2021   Uterine cramping 01/31/2021   Encounter for well woman exam with routine gynecological exam 01/31/2021   Screening examination for STD (sexually transmitted disease) 01/31/2021   Complex cyst of right ovary 03/08/2020   Mass of right ovary 01/26/2020   Irregular bleeding 01/14/2020   History of abnormal cervical Pap smear 05/19/2013    Past Surgical History:  Procedure Laterality Date   COLPOSCOPY     laser cone     MOUTH  SURGERY     x2    OB History     Gravida  0   Para  0   Term  0   Preterm  0   AB  0   Living  0      SAB  0   IAB  0   Ectopic  0   Multiple  0   Live Births               Home Medications    Prior to Admission medications   Medication Sig Start Date End Date Taking? Authorizing Provider  benzonatate (TESSALON) 100 MG capsule Take 1 capsule (100 mg total) by mouth 3 (three) times daily as needed for cough. Do not take with alcohol or while driving or operating heavy machinery.  May cause drowsiness. 02/06/22  Yes Eulogio Bear, NP  promethazine-dextromethorphan (PROMETHAZINE-DM) 6.25-15 MG/5ML syrup Take 5 mLs by mouth at bedtime as needed for cough. Do not take with alcohol or while driving or operating heavy machinery.  May cause drowsiness. 02/06/22  Yes Noemi Chapel A, NP  Drospirenone (SLYND) 4 MG TABS Take 1 tablet by mouth daily. 08/07/21   Estill Dooms, NP  mupirocin cream (BACTROBAN) 2 % Apply 1 Application topically 2 (two) times daily. 11/14/21   Estill Dooms, NP  ondansetron (ZOFRAN) 4 MG tablet Take 4 mg by mouth 4 (four) times daily as needed. 01/24/21   [provider]  ondansetron (ZOFRAN-ODT) 4 MG  disintegrating tablet Dissolve 1-2 tablets (4-8 mg total) by mouth every 6 (six) hours as needed. 08/07/21     rizatriptan (MAXALT-MLT) 10 MG disintegrating tablet Take 1 tablet (10 mg total) by mouth as needed for migraine. May repeat after 2 hours if needed.  Maximum 2 tablets in 24 hours. 10/03/21   Pieter Partridge, DO  silver sulfADIAZINE (SILVADENE) 1 % cream Apply 1 application. topically daily. Patient not taking: Reported on 11/14/2021 07/11/21   Estill Dooms, NP  tizanidine (ZANAFLEX) 2 MG capsule Take 1 capsule (2 mg total) by mouth at bedtime. 08/07/21     tiZANidine (ZANAFLEX) 2 MG tablet Take by mouth every 6 (six) hours as needed for muscle spasms.    [provider]  topiramate (TOPAMAX) 25 MG tablet Take  3 tablets (75 mg total) by mouth at bedtime. 10/03/21   Pieter Partridge, DO    Family History Family History  Problem Relation Age of Onset   Seizures Father    Heart disease Maternal Grandmother    Diabetes Maternal Grandmother    Stroke Maternal Grandmother    Hypertension Maternal Grandmother    Heart disease Maternal Grandfather    Diabetes Maternal Grandfather    Stroke Maternal Grandfather    Heart disease Paternal Grandmother    Diabetes Paternal Grandmother    Heart disease Paternal Grandfather    Diabetes Paternal Grandfather     Social History Social History   Tobacco Use   Smoking status: Every Day    Types: E-cigarettes   Smokeless tobacco: Never   Tobacco comments:    3 cigs per day   Vaping Use   Vaping Use: Every day  Substance Use Topics   Alcohol use: Yes    Comment: rarely   Drug use: No     Allergies   Patient has no known allergies.   Review of Systems Review of Systems Per HPI   Physical Exam Triage Vital Signs ED Triage Vitals [02/06/22 1303]  Enc Vitals Group     BP (!) 154/89     Pulse Rate 87     Resp 20     Temp 98.4 F (36.9 C)     Temp Source Oral     SpO2 99 %     Weight      Height      Head Circumference      Peak Flow      Pain Score 4     Pain Loc      Pain Edu?      Excl. in [place]?    No data found.  Updated Vital Signs BP (!) 154/89 (BP Location: Right Arm)   Pulse 87   Temp 98.4 F (36.9 C) (Oral)   Resp 20   SpO2 99%   Visual Acuity Right Eye Distance:   Left Eye Distance:   Bilateral Distance:    Right Eye Near:   Left Eye Near:    Bilateral Near:     Physical Exam Vitals and nursing note reviewed.  Constitutional:      General: She is not in acute distress.    Appearance: Normal appearance. She is not ill-appearing or toxic-appearing.  HENT:     Head: Normocephalic and atraumatic.     Right Ear: Tympanic membrane, ear canal and external ear normal.     Left Ear: Tympanic membrane, ear canal  and external ear normal.     Nose: Congestion and rhinorrhea present.  Mouth/Throat:     Mouth: Mucous membranes are moist.     Pharynx: Oropharynx is clear. Posterior oropharyngeal erythema present. No oropharyngeal exudate.  Eyes:     General: No scleral icterus.    Extraocular Movements: Extraocular movements intact.  Cardiovascular:     Rate and Rhythm: Normal rate and regular rhythm.  Pulmonary:     Effort: Pulmonary effort is normal. No respiratory distress.     Breath sounds: Normal breath sounds. No wheezing, rhonchi or rales.  Abdominal:     General: Abdomen is flat. Bowel sounds are normal. There is no distension.     Palpations: Abdomen is soft.     Tenderness: There is no abdominal tenderness. There is no guarding.  Musculoskeletal:     Cervical back: Normal range of motion and neck supple.  Lymphadenopathy:     Cervical: No cervical adenopathy.  Skin:    General: Skin is warm and dry.     Coloration: Skin is not jaundiced or pale.     Findings: No erythema or rash.  Neurological:     Mental Status: She is alert and oriented to person, place, and time.     Motor: No weakness.  Psychiatric:        Behavior: Behavior is cooperative.      UC Treatments / Results  Labs (all labs ordered are listed, but only abnormal results are displayed) Labs Reviewed  RESP PANEL BY RT-PCR (FLU A&B, COVID) [id number]    EKG   Radiology No results found.  Procedures Procedures (including critical care time)  Medications Ordered in UC Medications - No data to display  Initial Impression / Assessment and Plan / UC Course  I have reviewed the triage vital signs and the nursing notes.  Pertinent labs & imaging results that were available during my care of the patient were reviewed by me and considered in my medical decision making (see chart for details).   Patient is well-appearing, afebrile, not tachycardic, not tachypneic, oxygenating well on room air.  She is mildly  hypertensive today, likely secondary to acute illness.  Encounter for screening for COVID-19 Viral URI with cough Suspect viral etiology COVID-19, influenza testing obtained Supportive care discussed Start cough suppressant for dry cough ER and return precautions discussed Note given for work and school  The patient was given the opportunity to ask questions.  All questions answered to their satisfaction.  The patient is in agreement to this plan.    Final Clinical Impressions(s) / UC Diagnoses   Final diagnoses:  Encounter for screening for COVID-19  Viral URI with cough     Discharge Instructions      You have a viral upper respiratory infection.  Symptoms should improve over the next week to 10 days.  If you develop chest pain or shortness of breath, go to the emergency room.  We have tested you today for COVID-19 and influenza.  You will see the results in Mychart and we will call you with positive results.    Please stay home and isolate until you are aware of the results.    Some things that can make you feel better are: - Increased rest - Increasing fluid with water/sugar free electrolytes - Acetaminophen and ibuprofen as needed for fever/pain - Salt water gargling, chloraseptic spray and throat lozenges - OTC guaifenesin (Mucinex) 600 mg twice daily - Saline sinus flushes or a neti pot - Humidifying the air -Tessalon Perles during the day as needed for dry cough  and cough syrup at nighttime as needed for dry cough     ED Prescriptions     Medication Sig Dispense Auth. Provider   benzonatate (TESSALON) 100 MG capsule Take 1 capsule (100 mg total) by mouth 3 (three) times daily as needed for cough. Do not take with alcohol or while driving or operating heavy machinery.  May cause drowsiness. 21 capsule Noemi Chapel A, NP   promethazine-dextromethorphan (PROMETHAZINE-DM) 6.25-15 MG/5ML syrup Take 5 mLs by mouth at bedtime as needed for cough. Do not take with  alcohol or while driving or operating heavy machinery.  May cause drowsiness. 118 mL Eulogio Bear, NP      PDMP not reviewed this encounter.   Eulogio Bear, NP 02/06/22 1345

## 2022-02-06 NOTE — Discharge Instructions (Addendum)
You have a viral upper respiratory infection.  Symptoms should improve over the next week to 10 days.  If you develop chest pain or shortness of breath, go to the emergency room.  We have tested you today for COVID-19 and influenza.  You will see the results in Mychart and we will call you with positive results.    Please stay home and isolate until you are aware of the results.    Some things that can make you feel better are: - Increased rest - Increasing fluid with water/sugar free electrolytes - Acetaminophen and ibuprofen as needed for fever/pain - Salt water gargling, chloraseptic spray and throat lozenges - OTC guaifenesin (Mucinex) 600 mg twice daily - Saline sinus flushes or a neti pot - Humidifying the air -Tessalon Perles during the day as needed for dry cough and cough syrup at nighttime as needed for dry cough 

## 2022-02-06 NOTE — ED Triage Notes (Signed)
Pt reports cough, chills, body aches, head congestion, left jaw pain since Thursday.

## 2022-02-20 ENCOUNTER — Other Ambulatory Visit (HOSPITAL_COMMUNITY): Payer: Self-pay

## 2022-02-28 ENCOUNTER — Other Ambulatory Visit (HOSPITAL_COMMUNITY): Payer: Self-pay

## 2022-03-09 ENCOUNTER — Other Ambulatory Visit: Payer: Self-pay | Admitting: Adult Health

## 2022-03-09 DIAGNOSIS — Z1329 Encounter for screening for other suspected endocrine disorder: Secondary | ICD-10-CM

## 2022-03-09 DIAGNOSIS — Z1321 Encounter for screening for nutritional disorder: Secondary | ICD-10-CM

## 2022-03-09 DIAGNOSIS — Z13220 Encounter for screening for lipoid disorders: Secondary | ICD-10-CM

## 2022-03-09 DIAGNOSIS — D72829 Elevated white blood cell count, unspecified: Secondary | ICD-10-CM

## 2022-03-13 LAB — COMPREHENSIVE METABOLIC PANEL
ALT: 10 IU/L (ref 0–32)
AST: 16 IU/L (ref 0–40)
Albumin/Globulin Ratio: 1.6 (ref 1.2–2.2)
Albumin: 4.6 g/dL (ref 3.9–4.9)
Alkaline Phosphatase: 80 IU/L (ref 44–121)
BUN/Creatinine Ratio: 14 (ref 9–23)
BUN: 10 mg/dL (ref 6–20)
Bilirubin Total: 0.4 mg/dL (ref 0.0–1.2)
CO2: 18 mmol/L — ABNORMAL LOW (ref 20–29)
Calcium: 9.4 mg/dL (ref 8.7–10.2)
Chloride: 104 mmol/L (ref 96–106)
Creatinine, Ser: 0.74 mg/dL (ref 0.57–1.00)
Globulin, Total: 2.9 g/dL (ref 1.5–4.5)
Glucose: 83 mg/dL (ref 70–99)
Potassium: 4.2 mmol/L (ref 3.5–5.2)
Sodium: 138 mmol/L (ref 134–144)
Total Protein: 7.5 g/dL (ref 6.0–8.5)
eGFR: 109 mL/min/{1.73_m2} (ref >59)

## 2022-03-13 LAB — CBC WITH DIFFERENTIAL/PLATELET
Basophils Absolute: 0 10*3/uL (ref 0.0–0.2)
Basos: 1 %
EOS (ABSOLUTE): 0 10*3/uL (ref 0.0–0.4)
Eos: 1 %
Hematocrit: 41.6 % (ref 34.0–46.6)
Hemoglobin: 14 g/dL (ref 11.1–15.9)
Immature Grans (Abs): 0 10*3/uL (ref 0.0–0.1)
Immature Granulocytes: 0 %
Lymphocytes Absolute: 2 10*3/uL (ref 0.7–3.1)
Lymphs: 33 %
MCH: 31.2 pg (ref 26.6–33.0)
MCHC: 33.7 g/dL (ref 31.5–35.7)
MCV: 93 fL (ref 79–97)
Monocytes Absolute: 0.3 10*3/uL (ref 0.1–0.9)
Monocytes: 5 %
Neutrophils Absolute: 3.7 10*3/uL (ref 1.4–7.0)
Neutrophils: 60 %
Platelets: 211 10*3/uL (ref 150–450)
RBC: 4.49 x10E6/uL (ref 3.77–5.28)
RDW: 12.1 % (ref 11.7–15.4)
WBC: 6 10*3/uL (ref 3.4–10.8)

## 2022-03-13 LAB — LIPID PANEL
Chol/HDL Ratio: 2.9 ratio (ref 0.0–4.4)
Cholesterol, Total: 129 mg/dL (ref 100–199)
HDL: 44 mg/dL (ref >39)
LDL Chol Calc (NIH): 71 mg/dL (ref 0–99)
Triglycerides: 70 mg/dL (ref 0–149)
VLDL Cholesterol Cal: 14 mg/dL (ref 5–40)

## 2022-03-13 LAB — VITAMIN D 25 HYDROXY (VIT D DEFICIENCY, FRACTURES): Vit D, 25-Hydroxy: 32.8 ng/mL (ref 30.0–100.0)

## 2022-03-13 LAB — TSH: TSH: 2.73 u[IU]/mL (ref 0.450–4.500)

## 2022-04-03 NOTE — Progress Notes (Unsigned)
Virtual Visit via Video Note  Consent was obtained for video visit:  Yes.   Answered questions that patient had about telehealth interaction:  Yes.   I discussed the limitations, risks, security and privacy concerns of performing an evaluation and management service by telemedicine. I also discussed with the patient that there may be a patient responsible charge related to this service. The patient expressed understanding and agreed to proceed.  Pt location: Home Physician Location: office Name of referring provider:  Assunta Found, MD I connected with Ellyn Hack at patients initiation/request on 04/04/2022 at  9:30 AM EST by video enabled telemedicine application and verified that I am speaking with the correct person using two identifiers. Pt MRN:  [id number] Pt DOB:  12-Nov-1985 Video Participants:  Ellyn Hack   Assessment/Plan:   Migraine without aura, without status migrainosus, not intractable History of idiopathic intracranial hypertension - stable Cervicalgia   Migraine prevention:  to further optimize headache control, increase topiramate to 100mg  at bedtime. Migraine rescue: rizatriptan 10mg  for severe headaches, Tylenol for mild-moderate headaches Tizanidine as needed for neck pain Limit use of pain relievers to no more than 2 days out of week to prevent risk of rebound or medication-overuse headache. Keep headache diary Follow up 6 months.   Subjective:  Alexis Gomez is a 36 year old female who follows up for headache   UPDATE: Increased topiramate last visit to 75mg  Also switched to working day shift.  Overall improved. Intensity:  severe. Duration:  for mild headache:  2 hours with Tylenol; for severe headaches, until goes to sleep for the rest of night with rizatriptan. Frequency:  4-5 headache days a month (2 severe migraines over last 6 months)    Current medications:  rizatriptan 10mg , topiramate 75mg  at bedtime, tizanidine 2mg  (neck pain)    HISTORY: In August 2020, she began experiencing double vision, described as vertical, occurs whether she is walking or sitting and watching TV.  Symptoms are intermittent.  They occur daily, lasting a minute or two off and on throughout the day.  No visual obscurations but sometimes floaters.  She notes hearing whooshing sounds in her ears.  About one week prior to onset of double vision, she had a headache, moderate to severe bifrontal/temporal associated with nausea and vomiting.  It subsequently resolved once the double vision started.  She denies history of headaches.   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 She was evaluated by ophthalmologist, Dr. , on 12/02/18, who noted bilateral myopia, but also exam demonstrated bilateral papilledema.  Visual fields were full.  Because she endorsed double vision, myasthenia panel was checked on 12/16/2018, which was negative. 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 Lumbar puncture from 01/12/2019 demonstrated elevated opening pressure of 29.5 cm H2O.  CSF analysis was normal (including cell count, protein, glucose and culture).  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  She was started on acetazolamide 500mg  twice daily on 01/13/2019.  She had a repeat eye exam by Dr. Jethro Bolus on 03/02/2019 which demonstrated resolution of papilledema.  Patient also endorsed resolution of diplopia.  Acetazolamide was decreased to 250mg  twice daily.  Repeat eye exam with Dr. 02/15/2019 again demonstrated no papilledema.  She  had been doing well.  Acetazolamide was discontinued in January 2022 but she had a recurrence of a daily headache in March 2022, described as moderate  pressure and pounding headache from right posterior neck radiating to the right eye with photophobia, blurred vision and pulsatile tinnitus.  Acetazolamide was restarted on 07/08/2020 but repeat eye exam with Dr. Nile Riggs on 07/11/2020 revealed no evidence of papilledema.  She was therefore switched from acetazolamide to topiramate.   Past medications:  acetazolamide Past triptan:  sumatriptan   Past Medical History: Past Medical History:  Diagnosis Date   Boil of buttock 04/28/2015   History of abnormal cervical Pap smear 05/19/2013   Has had laser cone in past   Intracranial hypertension    Pseudotumor cerebri    Vaginal Pap smear, abnormal     Medications: Outpatient Encounter Medications as of 04/04/2022  Medication Sig   benzonatate (TESSALON) 100 MG capsule Take 1 capsule (100 mg total) by mouth 3 (three) times daily as needed for cough. Do not take with alcohol or while driving or operating heavy machinery.  May cause drowsiness.   Drospirenone (SLYND) 4 MG TABS Take 1 tablet by mouth daily.   mupirocin cream (BACTROBAN) 2 % Apply 1 Application topically 2 (two) times daily.   ondansetron (ZOFRAN) 4 MG tablet Take 4 mg by mouth 4 (four) times daily as needed.   ondansetron (ZOFRAN-ODT) 4 MG disintegrating tablet Dissolve 1-2 tablets (4-8 mg total) by mouth every 6 (six) hours as needed.   promethazine-dextromethorphan (PROMETHAZINE-DM) 6.25-15 MG/5ML syrup Take 5 mLs by mouth at bedtime as needed for cough. Do not take with alcohol or while driving or operating heavy machinery.  May cause drowsiness.   rizatriptan (MAXALT-MLT) 10 MG disintegrating tablet Take 1 tablet (10 mg total) by mouth as needed for migraine. May repeat after 2 hours if needed.  Maximum 2 tablets in 24 hours.   silver sulfADIAZINE (SILVADENE) 1 % cream Apply 1 application. topically daily.   tizanidine (ZANAFLEX) 2 MG capsule Take 1 capsule (2 mg total) by mouth at bedtime.   tiZANidine (ZANAFLEX) 2 MG tablet Take  by mouth every 6 (six) hours as needed for muscle spasms.   topiramate (TOPAMAX) 25 MG tablet Take 3 tablets (75 mg total) by mouth at bedtime.   No facility-administered encounter medications on file as of 04/04/2022.    Allergies: No Known Allergies  Family History: Family History  Problem Relation Age of Onset   Seizures Father    Heart disease Maternal Grandmother    Diabetes Maternal Grandmother    Stroke Maternal Grandmother    Hypertension Maternal Grandmother    Heart disease Maternal Grandfather    Diabetes Maternal Grandfather    Stroke Maternal Grandfather    Heart disease Paternal Grandmother    Diabetes Paternal Grandmother    Heart disease Paternal Grandfather    Diabetes Paternal Grandfather     Observations/Objective:   No acute distress.  Alert and oriented.  Speech fluent and not dysarthric.  Language intact.    Follow Up Instructions:    -I discussed the assessment and treatment plan with the patient. The patient was provided an opportunity to ask questions and all were answered. The patient agreed with the plan and demonstrated an understanding of the instructions.   The patient was advised to call back or seek an in-person evaluation if the symptoms worsen or if the condition fails to improve as anticipated.  Shon Millet, DO  CC: Assunta Found, MD

## 2022-04-04 ENCOUNTER — Telehealth: Payer: No Typology Code available for payment source | Admitting: Neurology

## 2022-04-04 ENCOUNTER — Encounter: Payer: Self-pay | Admitting: Neurology

## 2022-04-04 ENCOUNTER — Other Ambulatory Visit (HOSPITAL_COMMUNITY): Payer: Self-pay

## 2022-04-04 ENCOUNTER — Ambulatory Visit
Admission: EM | Admit: 2022-04-04 | Discharge: 2022-04-04 | Disposition: A | Discharge status: Home / Self Care | Payer: No Typology Code available for payment source | Attending: Nurse Practitioner | Admitting: Nurse Practitioner

## 2022-04-04 VITALS — Ht 62.0 in | Wt 155.0 lb

## 2022-04-04 DIAGNOSIS — L03115 Cellulitis of right lower limb: Secondary | ICD-10-CM | POA: Diagnosis not present

## 2022-04-04 DIAGNOSIS — M542 Cervicalgia: Secondary | ICD-10-CM

## 2022-04-04 DIAGNOSIS — G43009 Migraine without aura, not intractable, without status migrainosus: Secondary | ICD-10-CM | POA: Diagnosis not present

## 2022-04-04 MED ORDER — AMOXICILLIN 500 MG PO CAPS: 500.0000 mg | ORAL_CAPSULE | Freq: Three times a day (TID) | ORAL | 0 refills | Status: AC

## 2022-04-04 MED ORDER — TOPIRAMATE 25 MG PO TABS
100.0000 mg | ORAL_TABLET | Freq: Every day | ORAL | 0 refills | Status: DC
  Filled 2022-04-04: qty 120, 30d supply, fill #0

## 2022-04-04 NOTE — Discharge Instructions (Addendum)
Take medication as prescribed.  You can stop the doxycycline. Warm compresses to the affected area 3-4 times daily.  If the area becomes more painful and swollen, recommend applying ice.  Apply for 20 minutes, remove for 1 hour, then repeat is much as possible. Clean the area at least twice daily with Dial Gold bar soap or another type of antibacterial soap. Keep the area covered if it begins to drain. Go to the emergency department if you develop fever, chills, generalized fatigue, nausea, vomiting, or if the area of redness spreads into the right foot or up the right leg. Follow-up as needed.

## 2022-04-04 NOTE — ED Triage Notes (Signed)
Patient presents to UC for right lower extremity infection and fever. She believes she had an insect bite. 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 She has not completed full course. She states the provider who prescribed her the antibiotics instructed her to come in for eval to rule out possible abscess pocket or change in antibiotics. She took 500 mg of tylenol at 0800 today.

## 2022-04-04 NOTE — ED Provider Notes (Signed)
[facility]    CSN: [id number] Arrival date & time: 04/04/22  1008      History   Chief Complaint Chief Complaint  Patient presents with   Leg Swelling    HPI Alexis Gomez is a 36 y.o. female.   The history is provided by the patient.   Patient presents for complaints of swelling to the right lower leg that has been present over the past week.  Patient states she believes she was bitten by something while she was at work.  She states that symptoms occurred more than 1 week ago.  She states over the last 4 days, she has been taking doxycycline that was prescribed by one of the providers in her department.  She states that the provider recommended that she come in to be seen to rule out sepsis or determine if patient's antibiotics need to be changed.  Patient reports that she has doses of the doxycycline left.  She reports she has been taking Tylenol for her pain.  She denies chills, chest pain, shortness of breath, difficulty breathing.   Past Medical History:  Diagnosis Date   Boil of buttock 04/28/2015   History of abnormal cervical Pap smear 05/19/2013   Has had laser cone in past   Intracranial hypertension    Pseudotumor cerebri    Vaginal Pap smear, abnormal     Patient Active Problem List   Diagnosis Date Noted   Encounter for surveillance of contraceptive pills 11/14/2021   Folliculitis 11/14/2021   Encounter for initial prescription of contraceptive pills 08/07/2021   Encounter for IUD removal 08/07/2021   Pregnancy examination or test, negative result 08/07/2021   Boil of groin 07/20/2021   Uterine cramping 01/31/2021   Encounter for well woman exam with routine gynecological exam 01/31/2021   Screening examination for STD (sexually transmitted disease) 01/31/2021   Complex cyst of right ovary 03/08/2020   Mass of right ovary 01/26/2020   Irregular bleeding 01/14/2020   History of abnormal cervical Pap smear 05/19/2013    Past Surgical History:   Procedure Laterality Date   COLPOSCOPY     laser cone     MOUTH SURGERY     x2    OB History     Gravida  0   Para  0   Term  0   Preterm  0   AB  0   Living  0      SAB  0   IAB  0   Ectopic  0   Multiple  0   Live Births               Home Medications    Prior to Admission medications   Medication Sig Start Date End Date Taking? Authorizing Provider  amoxicillin (AMOXIL) 500 MG capsule Take 1 capsule (500 mg total) by mouth 3 (three) times daily for 5 days. 04/04/22 04/09/22 Yes Bertrum Helmstetter-Warren, Sadie Haber, NP  benzonatate (TESSALON) 100 MG capsule Take 1 capsule (100 mg total) by mouth 3 (three) times daily as needed for cough. Do not take with alcohol or while driving or operating heavy machinery.  May cause drowsiness. 02/06/22   Valentino Nose, NP  Drospirenone (SLYND) 4 MG TABS Take 1 tablet by mouth daily. 08/07/21   Adline Potter, NP  mupirocin cream (BACTROBAN) 2 % Apply 1 Application topically 2 (two) times daily. 11/14/21   Adline Potter, NP  ondansetron (ZOFRAN) 4 MG tablet Take 4 mg by  mouth 4 (four) times daily as needed. 01/24/21   [provider]  ondansetron (ZOFRAN-ODT) 4 MG disintegrating tablet Dissolve 1-2 tablets (4-8 mg total) by mouth every 6 (six) hours as needed. 08/07/21     promethazine-dextromethorphan (PROMETHAZINE-DM) 6.25-15 MG/5ML syrup Take 5 mLs by mouth at bedtime as needed for cough. Do not take with alcohol or while driving or operating heavy machinery.  May cause drowsiness. 02/06/22   Valentino Nose, NP  rizatriptan (MAXALT-MLT) 10 MG disintegrating tablet Take 1 tablet (10 mg total) by mouth as needed for migraine. May repeat after 2 hours if needed.  Maximum 2 tablets in 24 hours. 10/03/21   Drema Dallas, DO  silver sulfADIAZINE (SILVADENE) 1 % cream Apply 1 application. topically daily. 07/11/21   Adline Potter, NP  tizanidine (ZANAFLEX) 2 MG capsule Take 1 capsule (2 mg total) by mouth at  bedtime. 08/07/21     tiZANidine (ZANAFLEX) 2 MG tablet Take by mouth every 6 (six) hours as needed for muscle spasms.    [provider]  topiramate (TOPAMAX) 25 MG tablet Take 4 tablets (100 mg total) by mouth at bedtime. 04/04/22   Drema Dallas, DO    Family History Family History  Problem Relation Age of Onset   Seizures Father    Heart disease Maternal Grandmother    Diabetes Maternal Grandmother    Stroke Maternal Grandmother    Hypertension Maternal Grandmother    Heart disease Maternal Grandfather    Diabetes Maternal Grandfather    Stroke Maternal Grandfather    Heart disease Paternal Grandmother    Diabetes Paternal Grandmother    Heart disease Paternal Grandfather    Diabetes Paternal Grandfather     Social History Social History   Tobacco Use   Smoking status: Every Day    Types: E-cigarettes   Smokeless tobacco: Never   Tobacco comments:    3 cigs per day   Vaping Use   Vaping Use: Every day  Substance Use Topics   Alcohol use: Yes    Comment: rarely   Drug use: No     Allergies   Patient has no known allergies.   Review of Systems Review of Systems Per HPI  Physical Exam Triage Vital Signs ED Triage Vitals [04/04/22 1131]  Enc Vitals Group     BP 130/84     Pulse Rate (!) 114     Resp 16     Temp 100.3 F (37.9 C)     Temp Source Oral     SpO2 99 %     Weight      Height      Head Circumference      Peak Flow      Pain Score 0     Pain Loc      Pain Edu?      Excl. in GC?    No data found.  Updated Vital Signs BP 130/84 (BP Location: Right Arm)   Pulse (!) 114   Temp 100.3 F (37.9 C) (Oral)   Resp 16   SpO2 99%   Visual Acuity Right Eye Distance:   Left Eye Distance:   Bilateral Distance:    Right Eye Near:   Left Eye Near:    Bilateral Near:     Physical Exam Vitals and nursing note reviewed.  Constitutional:      General: She is not in acute distress.    Appearance: Normal appearance.  HENT:  Head: Normocephalic.  Eyes:     Extraocular Movements: Extraocular movements intact.     Pupils: Pupils are equal, round, and reactive to light.  Cardiovascular:     Rate and Rhythm: Normal rate and regular rhythm.  Pulmonary:     Effort: Pulmonary effort is normal.  Musculoskeletal:     Cervical back: Normal range of motion.  Lymphadenopathy:     Cervical: No cervical adenopathy.  Skin:    General: Skin is warm and dry.     Comments: Firm induration noted to the lateral aspect of the right lower extremity above the ankle.  Area measures approximately 4 cm in diameter.  Area is firm, and is warm to palpation.  There is no fluctuance, oozing, or drainage present.  Neurological:     General: No focal deficit present.     Mental Status: She is alert.  Psychiatric:        Mood and Affect: Mood normal.        Behavior: Behavior normal.      UC Treatments / Results  Labs (all labs ordered are listed, but only abnormal results are displayed) Labs Reviewed - No data to display  EKG   Radiology No results found.  Procedures Procedures (including critical care time)  Medications Ordered in UC Medications - No data to display  Initial Impression / Assessment and Plan / UC Course  I have reviewed the triage vital signs and the nursing notes.  Pertinent labs & imaging results that were available during my care of the patient were reviewed by me and considered in my medical decision making (see chart for details).  The patient is well-appearing, she is in no acute distress, vital signs are stable.  Symptoms are consistent with cellulitis of the right lower extremity.  Will have patient stop the doxycycline she is currently taken, and start amoxicillin 500 mg.  Supportive care recommendations were provided to the patient along with strict ER precautions.  Patient verbalizes understanding.  All questions were answered.  Patient is stable for discharge.  Final Clinical  Impressions(s) / UC Diagnoses   Final diagnoses:  Cellulitis of right lower extremity     Discharge Instructions      Take medication as prescribed.  You can stop the doxycycline. Warm compresses to the affected area 3-4 times daily.  If the area becomes more painful and swollen, recommend applying ice.  Apply for 20 minutes, remove for 1 hour, then repeat is much as possible. Clean the area at least twice daily with Dial Gold bar soap or another type of antibacterial soap. Keep the area covered if it begins to drain. Go to the emergency department if you develop fever, chills, generalized fatigue, nausea, vomiting, or if the area of redness spreads into the right foot or up the right leg. Follow-up as needed.     ED Prescriptions     Medication Sig Dispense Auth. Provider   amoxicillin (AMOXIL) 500 MG capsule Take 1 capsule (500 mg total) by mouth 3 (three) times daily for 5 days. 15 capsule Kamira Mellette-Warren, Sadie Haber, NP      PDMP not reviewed this encounter.   Abran Cantor, NP 04/04/22 1202

## 2022-04-16 ENCOUNTER — Other Ambulatory Visit (HOSPITAL_COMMUNITY): Payer: Self-pay

## 2022-04-29 ENCOUNTER — Encounter: Payer: Self-pay | Admitting: Neurology

## 2022-04-30 ENCOUNTER — Other Ambulatory Visit (HOSPITAL_COMMUNITY): Payer: Self-pay

## 2022-04-30 ENCOUNTER — Other Ambulatory Visit: Payer: Self-pay | Admitting: Neurology

## 2022-04-30 MED ORDER — TOPIRAMATE 100 MG PO TABS
100.0000 mg | ORAL_TABLET | Freq: Every day | ORAL | 5 refills | Status: DC
  Filled 2022-04-30: qty 30, 30d supply, fill #0
  Filled 2022-05-27: qty 30, 30d supply, fill #1
  Filled 2022-07-04: qty 30, 30d supply, fill #2
  Filled 2022-08-01: qty 30, 30d supply, fill #3
  Filled 2022-08-30: qty 30, 30d supply, fill #4
  Filled 2022-09-29: qty 30, 30d supply, fill #5

## 2022-07-05 ENCOUNTER — Other Ambulatory Visit: Payer: Self-pay

## 2022-07-11 ENCOUNTER — Other Ambulatory Visit (HOSPITAL_COMMUNITY): Payer: Self-pay

## 2022-07-15 ENCOUNTER — Other Ambulatory Visit (HOSPITAL_COMMUNITY): Payer: Self-pay

## 2022-07-16 ENCOUNTER — Other Ambulatory Visit (HOSPITAL_COMMUNITY): Payer: Self-pay

## 2022-07-16 MED ORDER — TIZANIDINE HCL 2 MG PO CAPS
2.0000 mg | ORAL_CAPSULE | Freq: Every day | ORAL | 0 refills | Status: DC
  Filled 2022-07-16: qty 90, 90d supply, fill #0

## 2022-08-01 ENCOUNTER — Other Ambulatory Visit: Payer: Self-pay | Admitting: Adult Health

## 2022-08-02 ENCOUNTER — Other Ambulatory Visit (HOSPITAL_COMMUNITY): Payer: Self-pay

## 2022-08-02 ENCOUNTER — Other Ambulatory Visit: Payer: Self-pay

## 2022-08-02 MED ORDER — SLYND 4 MG PO TABS
1.0000 | ORAL_TABLET | Freq: Every day | ORAL | 12 refills | Status: DC
  Filled 2022-08-02: qty 28, 28d supply, fill #0
  Filled 2022-08-30: qty 28, 28d supply, fill #1
  Filled 2022-09-29: qty 28, 28d supply, fill #2
  Filled 2022-10-28: qty 28, 28d supply, fill #3
  Filled 2022-11-26: qty 28, 28d supply, fill #4
  Filled 2022-12-23: qty 28, 28d supply, fill #5
  Filled 2023-01-18: qty 28, 28d supply, fill #6
  Filled 2023-02-14: qty 28, 28d supply, fill #7
  Filled 2023-03-17: qty 28, 28d supply, fill #8
  Filled 2023-04-11: qty 28, 28d supply, fill #9
  Filled 2023-05-08: qty 28, 28d supply, fill #10
  Filled 2023-06-05: qty 28, 28d supply, fill #11
  Filled 2023-07-03: qty 28, 28d supply, fill #12

## 2022-09-10 DIAGNOSIS — G932 Benign intracranial hypertension: Secondary | ICD-10-CM | POA: Diagnosis not present

## 2022-09-10 DIAGNOSIS — U071 COVID-19: Secondary | ICD-10-CM | POA: Diagnosis not present

## 2022-10-03 NOTE — Progress Notes (Signed)
NEUROLOGY FOLLOW UP OFFICE NOTE  Alexis Gomez [id number]  Assessment/Plan:   Migraine without aura, without status migrainosus, not intractable History of idiopathic intracranial hypertension - stable Cervicalgia   Migraine prevention:  Due to cognitive changes with topiramate, will have her start Emgality.  Once she finishes her finals in a month (doesn't want to decrease dose before finishing finals), will taper down on topiramate. Migraine rescue: rizatriptan 10mg  for severe headaches, Tylenol for mild-moderate headaches Tizanidine as needed for neck pain Limit use of pain relievers to no more than 2 days out of week to prevent risk of rebound or medication-overuse headache. Keep headache diary Follow up 6 months.   Subjective:  Alexis Gomez is a 37 year old female who follows up for headache   UPDATE: Increased topiramate. Intensity:  severe. Duration:  for mild headache:  2 hours with Tylenol; for severe headaches, until goes to sleep for the rest of night with rizatriptan.  Frequency:  a few mild headaches.  2 migraines over past 6 months.  However, she is having trouble concentrating and word-finding difficulty.   Sometimes notes whooshing in her head when she is lying down on her side.  Otherwise, no other symptoms.  No visual obscurations.  Has follow up eye appointment at [facility] in November.     Current medications:  rizatriptan 10mg , topiramate 100mg  at bedtime, tizanidine 2mg  (neck pain)   HISTORY: In August 2020, she began experiencing double vision, described as vertical, occurs whether she is walking or sitting and watching TV.  Symptoms are intermittent.  They occur daily, lasting a minute or two off and on throughout the day.  No visual obscurations but sometimes floaters.  She notes hearing whooshing sounds in her ears.  About one week prior to onset of double vision, she had a headache, moderate to severe bifrontal/temporal associated  with nausea and vomiting.  It subsequently resolved once the double vision started.  She denies history of headaches.   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 She was evaluated by ophthalmologist, Dr. Jethro Bolus, on 12/02/18, who noted bilateral myopia, but also exam demonstrated bilateral papilledema.  Visual fields were full.  Because she endorsed double vision, myasthenia panel was checked on 12/16/2018, which was negative. 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 Lumbar puncture from 01/12/2019 demonstrated elevated opening pressure of 29.5 cm H2O.  CSF analysis was normal (including cell count, protein, glucose and culture).  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  She was started on acetazolamide 500mg  twice daily on 01/13/2019.  She had a repeat eye exam by Dr. Nile Riggs on 03/02/2019 which demonstrated resolution of papilledema.  Patient also endorsed resolution of diplopia.  Acetazolamide was decreased to 250mg  twice daily.  Repeat eye exam with Dr. Nile Riggs again demonstrated no papilledema.  She had been doing well.  Acetazolamide was discontinued in January 2022 but she had a recurrence of a daily headache in March 2022, described as moderate pressure and pounding headache from right posterior neck radiating to the right eye with photophobia, blurred vision and pulsatile tinnitus.  Acetazolamide was restarted on 07/08/2020 but repeat eye exam with Dr. Nile Riggs on 07/11/2020 revealed no evidence of papilledema.  She was therefore  switched from acetazolamide to topiramate.   Past medications:  acetazolamide Past triptan:  sumatriptan  PAST MEDICAL HISTORY: Past Medical History:  Diagnosis Date   Boil of  buttock 04/28/2015   History of abnormal cervical Pap smear 05/19/2013   Has had laser cone in past   Intracranial hypertension    Pseudotumor cerebri    Vaginal Pap smear, abnormal     MEDICATIONS: Current Outpatient Medications on File Prior to Visit  Medication Sig Dispense Refill   topiramate (TOPAMAX) 100 MG tablet Take 1 tablet (100 mg total) by mouth at bedtime. 30 tablet 5   benzonatate (TESSALON) 100 MG capsule Take 1 capsule (100 mg total) by mouth 3 (three) times daily as needed for cough. Do not take with alcohol or while driving or operating heavy machinery.  May cause drowsiness. 21 capsule 0   Drospirenone (SLYND) 4 MG TABS Take 1 tablet by mouth daily. 28 tablet 12   mupirocin cream (BACTROBAN) 2 % Apply 1 Application topically 2 (two) times daily. 15 g 1   ondansetron (ZOFRAN) 4 MG tablet Take 4 mg by mouth 4 (four) times daily as needed.     ondansetron (ZOFRAN-ODT) 4 MG disintegrating tablet Dissolve 1-2 tablets (4-8 mg total) by mouth every 6 (six) hours as needed. 30 tablet 6   promethazine-dextromethorphan (PROMETHAZINE-DM) 6.25-15 MG/5ML syrup Take 5 mLs by mouth at bedtime as needed for cough. Do not take with alcohol or while driving or operating heavy machinery.  May cause drowsiness. 118 mL 0   rizatriptan (MAXALT-MLT) 10 MG disintegrating tablet Take 1 tablet (10 mg total) by mouth as needed for migraine. May repeat after 2 hours if needed.  Maximum 2 tablets in 24 hours. 9 tablet 5   silver sulfADIAZINE (SILVADENE) 1 % cream Apply 1 application. topically daily. 50 g 0   tizanidine (ZANAFLEX) 2 MG capsule Take 1 capsule (2 mg total) by mouth at bedtime. 90 capsule 0   tiZANidine (ZANAFLEX) 2 MG tablet Take by mouth every 6 (six) hours as needed for muscle spasms.     No current facility-administered medications on file prior to visit.    ALLERGIES: No Known Allergies  FAMILY HISTORY: Family History  Problem Relation Age of Onset   Seizures Father    Heart  disease Maternal Grandmother    Diabetes Maternal Grandmother    Stroke Maternal Grandmother    Hypertension Maternal Grandmother    Heart disease Maternal Grandfather    Diabetes Maternal Grandfather    Stroke Maternal Grandfather    Heart disease Paternal Grandmother    Diabetes Paternal Grandmother    Heart disease Paternal Grandfather    Diabetes Paternal Grandfather       Objective:  Blood pressure 125/83, pulse 96, height 5\' 3"  (1.6 m), weight 164 lb 9.6 oz (74.7 kg), SpO2 99 %. General: No acute distress.  Patient appears well-groomed.   Head:  Normocephalic/atraumatic Eyes:  Fundi examined but not visualized Neck: supple, no paraspinal tenderness, full range of motion Heart:  Regular rate and rhythm Lungs:  Clear to auscultation bilaterally Back: No paraspinal tenderness Neurological Exam: alert and oriented.  Speech fluent and not dysarthric, language intact.  CN II-XII intact. Bulk and tone normal, muscle strength 5/5 throughout.  Sensation to light touch intact.  Deep tendon reflexes 2+ throughout, toes downgoing.  Finger to nose testing intact.  Gait normal, Romberg negative.   Shon Millet, DO  CC: Assunta Found, MD

## 2022-10-05 ENCOUNTER — Ambulatory Visit: Payer: 59 | Admitting: Neurology

## 2022-10-05 ENCOUNTER — Other Ambulatory Visit (HOSPITAL_COMMUNITY): Payer: Self-pay

## 2022-10-05 ENCOUNTER — Encounter: Payer: Self-pay | Admitting: Neurology

## 2022-10-05 VITALS — BP 125/83 | HR 96 | Ht 63.0 in | Wt 164.6 lb

## 2022-10-05 DIAGNOSIS — G43009 Migraine without aura, not intractable, without status migrainosus: Secondary | ICD-10-CM | POA: Diagnosis not present

## 2022-10-05 DIAGNOSIS — Z8669 Personal history of other diseases of the nervous system and sense organs: Secondary | ICD-10-CM | POA: Diagnosis not present

## 2022-10-05 MED ORDER — RIZATRIPTAN BENZOATE 10 MG PO TBDP
10.0000 mg | ORAL_TABLET | ORAL | 11 refills | Status: DC | PRN
  Filled 2022-10-05: qty 9, 30d supply, fill #0

## 2022-10-05 MED ORDER — EMGALITY 120 MG/ML ~~LOC~~ SOAJ
120.0000 mg | SUBCUTANEOUS | 11 refills | Status: DC
  Filled 2022-10-05 – 2022-11-26 (×2): qty 1, 28d supply, fill #0
  Filled 2022-11-28: qty 1, 30d supply, fill #0
  Filled 2022-12-23: qty 1, 30d supply, fill #1
  Filled 2023-01-22: qty 1, 30d supply, fill #2
  Filled 2023-02-21: qty 1, 30d supply, fill #3
  Filled 2023-03-17: qty 1, 30d supply, fill #4

## 2022-10-05 MED ORDER — TIZANIDINE HCL 2 MG PO CAPS
2.0000 mg | ORAL_CAPSULE | Freq: Every day | ORAL | 3 refills | Status: DC
  Filled 2022-10-05: qty 90, 90d supply, fill #0
  Filled 2023-02-14: qty 90, 90d supply, fill #1
  Filled 2023-05-29: qty 90, 90d supply, fill #2
  Filled 2023-08-26: qty 90, 90d supply, fill #3

## 2022-10-05 NOTE — Patient Instructions (Signed)
Start Emgality - 2 injections for first dose, then 1 injection every 28 days thereafter Continue topiramate 100mg  at bedtime for now.  Once you finish your exams, contact me and we will decrease dose Rizatriptan as needed Tizanidine for neck pain Follow up 6 months.

## 2022-10-12 ENCOUNTER — Other Ambulatory Visit (HOSPITAL_COMMUNITY): Payer: Self-pay

## 2022-10-22 ENCOUNTER — Other Ambulatory Visit (HOSPITAL_COMMUNITY): Payer: Self-pay

## 2022-10-23 ENCOUNTER — Other Ambulatory Visit: Payer: Self-pay | Admitting: Adult Health

## 2022-10-23 MED ORDER — SILVER SULFADIAZINE 1 % EX CREA: 1.0000 | TOPICAL_CREAM | Freq: Every day | CUTANEOUS | 0 refills | Status: DC

## 2022-10-23 NOTE — Telephone Encounter (Signed)
 Refilled silvadene

## 2022-10-25 ENCOUNTER — Telehealth: Payer: Self-pay | Admitting: Neurology

## 2022-10-25 NOTE — Telephone Encounter (Signed)
Pt is calling in stating that she was not able to get on to her mychart to contact the office to see if the insurance approved her for Rx Galcanezumab-gnlm (EMGALITY) 120 MG.  She is out of the medication and would like to see if a refill can be called into the pharmacy.

## 2022-10-28 ENCOUNTER — Other Ambulatory Visit: Payer: Self-pay | Admitting: Neurology

## 2022-10-28 ENCOUNTER — Encounter: Payer: Self-pay | Admitting: Neurology

## 2022-10-29 ENCOUNTER — Other Ambulatory Visit (HOSPITAL_COMMUNITY): Payer: Self-pay

## 2022-10-29 ENCOUNTER — Other Ambulatory Visit: Payer: Self-pay

## 2022-10-29 MED ORDER — TOPIRAMATE 100 MG PO TABS
100.0000 mg | ORAL_TABLET | Freq: Every day | ORAL | 5 refills | Status: DC
  Filled 2022-10-29: qty 30, 30d supply, fill #0
  Filled 2022-11-26: qty 30, 30d supply, fill #1
  Filled 2022-12-31: qty 30, 30d supply, fill #2

## 2022-10-30 ENCOUNTER — Other Ambulatory Visit (HOSPITAL_COMMUNITY): Payer: Self-pay

## 2022-10-30 ENCOUNTER — Telehealth: Payer: Self-pay | Admitting: Pharmacy Technician

## 2022-10-30 NOTE — Telephone Encounter (Signed)
Pharmacy Patient Advocate Encounter   Received notification from Pt Calls Messages that prior authorization for [facility] 120MG  is required/requested.   Insurance verification completed.   The patient is insured through [facility] .   Per test claim: PA submitted to [employer] via CoverMyMeds Key/confirmation #/EOC [id number] Status is pending

## 2022-10-30 NOTE — Telephone Encounter (Signed)
PA has been submit and telephone encounter has been created.

## 2022-10-31 ENCOUNTER — Other Ambulatory Visit (HOSPITAL_COMMUNITY): Payer: Self-pay

## 2022-10-31 NOTE — Telephone Encounter (Signed)
Pharmacy Patient Advocate Encounter  Received notification from [facility] that Prior Authorization for [facility] 120MG  has been LOADING DOSE APPROVED from 7.24.24 to 8.23.24. Ran test claim, Copay is $125.  PA #/Case ID/Reference #: [id number]  MAINTENANCE DOSE APPROVED from 8.16.24 to 1.16.25. Ran test claim, Copay is $125.  PA #/Case ID/Reference #: [id number]

## 2022-11-09 ENCOUNTER — Other Ambulatory Visit (HOSPITAL_COMMUNITY): Payer: Self-pay

## 2022-11-13 ENCOUNTER — Other Ambulatory Visit (HOSPITAL_COMMUNITY): Payer: Self-pay

## 2022-11-26 ENCOUNTER — Other Ambulatory Visit: Payer: Self-pay

## 2022-11-26 ENCOUNTER — Other Ambulatory Visit (HOSPITAL_COMMUNITY): Payer: Self-pay

## 2022-11-26 ENCOUNTER — Telehealth: Payer: Self-pay | Admitting: Neurology

## 2022-11-26 NOTE — Telephone Encounter (Signed)
Pt is calling in stating that she went to her pharmacy and was not able to get Rx Emgality filled due to it needing to have a PA per the pharmacist. Pt stated that she is out of the medication and would like to have a call back.

## 2022-11-28 ENCOUNTER — Other Ambulatory Visit (HOSPITAL_COMMUNITY): Payer: Self-pay

## 2022-11-28 NOTE — Telephone Encounter (Signed)
Patient should be able to pick up now.

## 2022-11-28 NOTE — Telephone Encounter (Signed)
Per PA note PA approved, Received notification from [facility] that Prior Authorization for [facility] 120MG  has been LOADING DOSE APPROVED from 7.24.24 to 8.23.24. Ran test claim, Copay is $125.  PA #/Case ID/Reference #: [id number]   MAINTENANCE DOSE APPROVED from 8.16.24 to 1.16.25. Ran test claim, Copay is $125.  PA #/Case ID/Reference #: [id number]

## 2022-12-24 ENCOUNTER — Other Ambulatory Visit: Payer: Self-pay

## 2022-12-24 ENCOUNTER — Other Ambulatory Visit (HOSPITAL_COMMUNITY): Payer: Self-pay

## 2022-12-30 ENCOUNTER — Encounter: Payer: Self-pay | Admitting: Neurology

## 2023-01-06 ENCOUNTER — Other Ambulatory Visit: Payer: Self-pay | Admitting: Neurology

## 2023-01-06 MED ORDER — TOPIRAMATE 25 MG PO TABS
25.0000 mg | ORAL_TABLET | Freq: Every day | ORAL | 0 refills | Status: DC
  Filled 2023-01-06: qty 7, 7d supply, fill #0

## 2023-01-07 ENCOUNTER — Other Ambulatory Visit: Payer: Self-pay

## 2023-01-07 ENCOUNTER — Other Ambulatory Visit (HOSPITAL_COMMUNITY): Payer: Self-pay

## 2023-01-18 ENCOUNTER — Other Ambulatory Visit (HOSPITAL_COMMUNITY): Payer: Self-pay

## 2023-01-25 ENCOUNTER — Other Ambulatory Visit: Payer: Self-pay | Admitting: Medical Genetics

## 2023-01-25 DIAGNOSIS — Z006 Encounter for examination for normal comparison and control in clinical research program: Secondary | ICD-10-CM

## 2023-02-21 ENCOUNTER — Other Ambulatory Visit (HOSPITAL_COMMUNITY): Payer: Self-pay

## 2023-03-04 DIAGNOSIS — H5213 Myopia, bilateral: Secondary | ICD-10-CM | POA: Diagnosis not present

## 2023-03-21 DIAGNOSIS — G932 Benign intracranial hypertension: Secondary | ICD-10-CM | POA: Diagnosis not present

## 2023-03-21 DIAGNOSIS — G43909 Migraine, unspecified, not intractable, without status migrainosus: Secondary | ICD-10-CM | POA: Diagnosis not present

## 2023-04-09 NOTE — Progress Notes (Signed)
 NEUROLOGY FOLLOW UP OFFICE NOTE  Alexis Gomez [id number]  Assessment/Plan:   Migraine without aura, without status migrainosus, not intractable History of idiopathic intracranial hypertension - stable Cervicalgia   Migraine prevention:  Emgality  . Migraine rescue: rizatriptan  10mg  for severe headaches, Tylenol  for mild-moderate headaches Tizanidine  as needed for neck pain Limit use of pain relievers to no more than 2 days out of week to prevent risk of rebound or medication-overuse headache. Keep headache diary Follow up 6 months.   Subjective:  Alexis Gomez is a 37 year old female who follows up for headache   UPDATE: Started Emgality  and tapered off topiramate . Intensity:  mild severe. Duration:  for mild headache:  2 hours with Tylenol ; for severe headaches, 1 to 2 hours with rizatriptan . Frequency:  usually 1 or 2 a month.  Had 4 for four weeks between November and December.  Since discontinuing topiramate , concentration has improved and no longer with word-finding difficulty.    Had repeat eye exam, which was unremarkable.     Current medications:  rizatriptan  10mg , Emgality , tizanidine  2mg  (neck pain)   HISTORY: In August 2020, she began experiencing double vision, described as vertical, occurs whether she is walking or sitting and watching TV.  Symptoms are intermittent.  They occur daily, lasting a minute or two off and on throughout the day.  No visual obscurations but sometimes floaters.  She notes hearing whooshing sounds in her ears.  About one week prior to onset of double vision, she had a headache, moderate to severe bifrontal/temporal associated with nausea and vomiting.  It subsequently resolved once the double vision started.  She denies history of headaches.   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 She was evaluated by ophthalmologist, Dr. Oneil Platts, on 12/02/18, who noted bilateral myopia, but also exam demonstrated bilateral papilledema.  Visual fields were full.  Because she endorsed double vision, myasthenia panel was checked on 12/16/2018, which was negative. 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 Lumbar puncture from 01/12/2019 demonstrated elevated opening pressure of 29.5 cm H2O.  CSF analysis was normal (including cell count, protein, glucose and culture).  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  She was started on acetazolamide  500mg  twice daily on 01/13/2019.  She had a repeat eye exam by Dr. Platts on 03/02/2019 which demonstrated resolution of papilledema.  Patient also endorsed resolution of diplopia.  Acetazolamide  was decreased to 250mg  twice daily.  Repeat eye exam with Dr. Platts again demonstrated no papilledema.  She had been doing well.  Acetazolamide  was discontinued in January 2022 but she had a recurrence of a daily headache in March 2022, described as moderate pressure and pounding headache from right posterior neck radiating to the right eye with photophobia, blurred vision and pulsatile tinnitus.  Acetazolamide  was restarted on 07/08/2020 but repeat eye exam with Dr. Platts on 07/11/2020 revealed no evidence of papilledema.  She was therefore switched from acetazolamide  to topiramate .   Past medications:  acetazolamide  Past triptan:  sumatriptan  Past antiepileptic:  topiramate  (cognitive changes)  PAST MEDICAL HISTORY: Past Medical History:  Diagnosis Date   Boil of buttock 04/28/2015   History of abnormal cervical Pap smear 05/19/2013   Has had laser cone in  past   Intracranial hypertension    Pseudotumor cerebri    Vaginal Pap smear, abnormal     MEDICATIONS: Current Outpatient Medications on File Prior to Visit   Medication Sig Dispense Refill   benzonatate  (TESSALON ) 100 MG capsule Take 1 capsule (100 mg total) by mouth 3 (three) times daily as needed for cough. Do not take with alcohol or while driving or operating heavy machinery.  May cause drowsiness. 21 capsule 0   Drospirenone  (SLYND ) 4 MG TABS Take 1 tablet by mouth daily. 28 tablet 12   Galcanezumab -gnlm (EMGALITY ) 120 MG/ML SOAJ Inject 120 mg into the skin every 28 (twenty-eight) days. 1 mL 11   mupirocin  cream (BACTROBAN ) 2 % Apply 1 Application topically 2 (two) times daily. 15 g 1   ondansetron  (ZOFRAN ) 4 MG tablet Take 4 mg by mouth 4 (four) times daily as needed.     ondansetron  (ZOFRAN -ODT) 4 MG disintegrating tablet Dissolve 1-2 tablets (4-8 mg total) by mouth every 6 (six) hours as needed. 30 tablet 6   promethazine -dextromethorphan (PROMETHAZINE -DM) 6.25-15 MG/5ML syrup Take 5 mLs by mouth at bedtime as needed for cough. Do not take with alcohol or while driving or operating heavy machinery.  May cause drowsiness. 118 mL 0   rizatriptan  (MAXALT -MLT) 10 MG disintegrating tablet Take 1 tablet (10 mg total) by mouth as needed for migraine. May repeat after 2 hours if needed.  Maximum 2 tablets in 24 hours. 9 tablet 11   silver  sulfADIAZINE  (SILVADENE ) 1 % cream Apply 1 Application topically daily. 50 g 0   tizanidine  (ZANAFLEX ) 2 MG capsule Take 1 capsule (2 mg total) by mouth at bedtime. 90 capsule 3   topiramate  (TOPAMAX ) 25 MG tablet Take 1 tablet (25 mg total) by mouth at bedtime. 7 tablet 0   No current facility-administered medications on file prior to visit.    ALLERGIES: No Known Allergies  FAMILY HISTORY: Family History  Problem Relation Age of Onset   Seizures Father    Heart disease Maternal Grandmother    Diabetes Maternal Grandmother    Stroke Maternal Grandmother    Hypertension Maternal Grandmother    Heart disease Maternal Grandfather    Diabetes Maternal Grandfather    Stroke Maternal Grandfather    Heart  disease Paternal Grandmother    Diabetes Paternal Grandmother    Heart disease Paternal Grandfather    Diabetes Paternal Grandfather       Objective:  Blood pressure 132/80, pulse 96, height 5' 3 (1.6 m), weight 180 lb (81.6 kg), SpO2 99%. General: No acute distress.  Patient appears well-groomed.      Juliene Dunnings, DO  CC: [name], MD

## 2023-04-11 ENCOUNTER — Telehealth: Payer: Self-pay

## 2023-04-11 ENCOUNTER — Other Ambulatory Visit (HOSPITAL_COMMUNITY): Payer: Self-pay

## 2023-04-11 ENCOUNTER — Encounter: Payer: Self-pay | Admitting: Neurology

## 2023-04-11 ENCOUNTER — Ambulatory Visit: Payer: 59 | Admitting: Neurology

## 2023-04-11 VITALS — BP 132/80 | HR 96 | Ht 63.0 in | Wt 180.0 lb

## 2023-04-11 DIAGNOSIS — M542 Cervicalgia: Secondary | ICD-10-CM

## 2023-04-11 DIAGNOSIS — Z8669 Personal history of other diseases of the nervous system and sense organs: Secondary | ICD-10-CM

## 2023-04-11 DIAGNOSIS — G43009 Migraine without aura, not intractable, without status migrainosus: Secondary | ICD-10-CM | POA: Diagnosis not present

## 2023-04-11 MED ORDER — EMGALITY 120 MG/ML ~~LOC~~ SOAJ
120.0000 mg | SUBCUTANEOUS | 11 refills | Status: DC
  Filled 2023-04-11: qty 1, 30d supply, fill #0
  Filled 2023-05-08: qty 1, 30d supply, fill #1
  Filled 2023-06-05 (×2): qty 1, 30d supply, fill #2
  Filled 2023-07-03: qty 1, 30d supply, fill #3
  Filled 2023-08-05: qty 1, 30d supply, fill #4
  Filled 2023-08-28: qty 1, 30d supply, fill #5
  Filled 2023-09-27: qty 1, 30d supply, fill #6
  Filled 2023-10-28: qty 1, 30d supply, fill #7
  Filled 2023-11-25: qty 1, 30d supply, fill #8
  Filled 2023-12-22: qty 1, 30d supply, fill #9
  Filled 2024-01-20: qty 1, 30d supply, fill #10
  Filled 2024-02-14: qty 1, 30d supply, fill #11

## 2023-04-11 MED ORDER — RIZATRIPTAN BENZOATE 10 MG PO TBDP
10.0000 mg | ORAL_TABLET | ORAL | 11 refills | Status: AC | PRN
  Filled 2023-04-11: qty 9, 30d supply, fill #0

## 2023-04-11 NOTE — Telephone Encounter (Signed)
 Per patient she has the focus plan with cone, So she may need a new PA.  PA team an you check on that for me.

## 2023-04-12 ENCOUNTER — Telehealth: Payer: Self-pay

## 2023-04-12 ENCOUNTER — Other Ambulatory Visit: Payer: Self-pay

## 2023-04-12 ENCOUNTER — Other Ambulatory Visit (HOSPITAL_COMMUNITY): Payer: Self-pay

## 2023-04-12 NOTE — Telephone Encounter (Signed)
 PA request has been Submitted. New Encounter created for follow up. For additional info see Pharmacy Prior Auth telephone encounter from 04/12/2023.

## 2023-04-12 NOTE — Telephone Encounter (Signed)
*  [facility]  Pharmacy Patient Advocate Encounter   Received notification from Pt Calls Messages that prior authorization for Emgality  120MG /ML auto-injectors (migraine)  is required/requested.   Insurance verification completed.   The patient is insured through [facility] .   Per test claim: PA required; PA submitted to above mentioned insurance via Fax Key/confirmation #/EOC [id number] Status is pending

## 2023-04-15 ENCOUNTER — Other Ambulatory Visit (HOSPITAL_COMMUNITY): Payer: Self-pay

## 2023-04-15 NOTE — Telephone Encounter (Signed)
 Pharmacy Patient Advocate Encounter  Received notification from [employer] that Prior Authorization for Emgality  120mg m/ml has been APPROVED from 04/23/23 to 04/21/24 (1ml per 28 days)  filled 04/11/23, dispensed (pt picked up) today.   PA #/Case ID/Reference #: [id number]

## 2023-04-30 ENCOUNTER — Other Ambulatory Visit: Payer: Self-pay | Admitting: Adult Health

## 2023-04-30 MED ORDER — SULFAMETHOXAZOLE-TRIMETHOPRIM 800-160 MG PO TABS: 1.0000 | ORAL_TABLET | Freq: Two times a day (BID) | ORAL | 0 refills | Status: DC

## 2023-04-30 NOTE — Progress Notes (Signed)
Rx septra ds 

## 2023-05-08 ENCOUNTER — Other Ambulatory Visit (HOSPITAL_COMMUNITY): Payer: Self-pay

## 2023-05-13 ENCOUNTER — Other Ambulatory Visit (HOSPITAL_COMMUNITY): Payer: Self-pay

## 2023-06-05 ENCOUNTER — Other Ambulatory Visit (HOSPITAL_COMMUNITY): Payer: Self-pay

## 2023-06-05 ENCOUNTER — Other Ambulatory Visit: Payer: Self-pay

## 2023-07-03 ENCOUNTER — Other Ambulatory Visit (HOSPITAL_COMMUNITY): Payer: Self-pay

## 2023-07-17 ENCOUNTER — Other Ambulatory Visit (HOSPITAL_COMMUNITY): Payer: Self-pay

## 2023-07-17 DIAGNOSIS — Z111 Encounter for screening for respiratory tuberculosis: Secondary | ICD-10-CM | POA: Diagnosis not present

## 2023-07-17 DIAGNOSIS — Z01419 Encounter for gynecological examination (general) (routine) without abnormal findings: Secondary | ICD-10-CM | POA: Diagnosis not present

## 2023-07-17 DIAGNOSIS — Z0184 Encounter for antibody response examination: Secondary | ICD-10-CM | POA: Diagnosis not present

## 2023-07-17 MED ORDER — BUPROPION HCL ER (XL) 150 MG PO TB24
150.0000 mg | ORAL_TABLET | Freq: Every day | ORAL | 1 refills | Status: DC
  Filled 2023-07-17: qty 7, 7d supply, fill #0
  Filled 2023-07-17: qty 83, 83d supply, fill #0
  Filled 2023-07-17: qty 6, 6d supply, fill #0

## 2023-08-05 ENCOUNTER — Other Ambulatory Visit (HOSPITAL_COMMUNITY): Payer: Self-pay

## 2023-08-05 ENCOUNTER — Other Ambulatory Visit: Payer: Self-pay

## 2023-08-05 ENCOUNTER — Other Ambulatory Visit: Payer: Self-pay | Admitting: Adult Health

## 2023-08-05 MED ORDER — SLYND 4 MG PO TABS
1.0000 | ORAL_TABLET | Freq: Every day | ORAL | 12 refills | Status: AC
  Filled 2023-08-05: qty 28, 28d supply, fill #0
  Filled 2023-08-28: qty 28, 28d supply, fill #1
  Filled 2023-09-27: qty 28, 28d supply, fill #2
  Filled 2023-10-28: qty 28, 28d supply, fill #3
  Filled 2023-11-25: qty 28, 28d supply, fill #4
  Filled 2023-12-22: qty 28, 28d supply, fill #5
  Filled 2024-01-20: qty 28, 28d supply, fill #6
  Filled 2024-02-14: qty 28, 28d supply, fill #7
  Filled 2024-03-13: qty 28, 28d supply, fill #8
  Filled 2024-04-10: qty 28, 28d supply, fill #9
  Filled 2024-05-07: qty 84, 84d supply, fill #10

## 2023-08-05 MED ORDER — ONDANSETRON 4 MG PO TBDP
4.0000 mg | ORAL_TABLET | Freq: Four times a day (QID) | ORAL | 6 refills | Status: DC | PRN
  Filled 2023-08-05: qty 30, 4d supply, fill #0

## 2023-08-29 ENCOUNTER — Other Ambulatory Visit (HOSPITAL_COMMUNITY): Payer: Self-pay

## 2023-09-27 ENCOUNTER — Other Ambulatory Visit (HOSPITAL_COMMUNITY): Payer: Self-pay

## 2023-10-03 ENCOUNTER — Ambulatory Visit

## 2023-10-03 ENCOUNTER — Ambulatory Visit: Admitting: Podiatry

## 2023-10-03 ENCOUNTER — Other Ambulatory Visit: Payer: Self-pay | Admitting: Podiatry

## 2023-10-03 ENCOUNTER — Encounter: Payer: Self-pay | Admitting: Podiatry

## 2023-10-03 DIAGNOSIS — S92335A Nondisplaced fracture of third metatarsal bone, left foot, initial encounter for closed fracture: Secondary | ICD-10-CM

## 2023-10-03 DIAGNOSIS — M67479 Ganglion, unspecified ankle and foot: Secondary | ICD-10-CM

## 2023-10-03 DIAGNOSIS — M19072 Primary osteoarthritis, left ankle and foot: Secondary | ICD-10-CM | POA: Diagnosis not present

## 2023-10-04 NOTE — Progress Notes (Signed)
 Subjective:  Patient ID: Alexis Gomez, female    DOB: 1985/05/22,  MRN: [id number] HPI Chief Complaint  Patient presents with   Foot Pain    Dorsal midfoot left - redness, swelling, knot x 2 weeks, today it looks better, no injury, tried ice and epsom soaks   New Patient (Initial Visit)    Est pt 2021    38 y.o. female presents with the above complaint.   ROS: Denies fever chills nausea mobic  muscle aches pains calf pain back pain chest pain shortness of breath.  Past Medical History:  Diagnosis Date   Boil of buttock 04/28/2015   History of abnormal cervical Pap smear 05/19/2013   Has had laser cone in past   Intracranial hypertension    Pseudotumor cerebri    Vaginal Pap smear, abnormal    Past Surgical History:  Procedure Laterality Date   COLPOSCOPY     laser cone     MOUTH SURGERY     x2    Current Outpatient Medications:    buPROPion  (WELLBUTRIN  XL) 150 MG 24 hr tablet, Take 1 tablet (150 mg total) by mouth daily., Disp: 90 tablet, Rfl: 1   Drospirenone  (SLYND ) 4 MG TABS, Take 1 tablet by mouth daily., Disp: 28 tablet, Rfl: 12   Galcanezumab -gnlm (EMGALITY ) 120 MG/ML SOAJ, Inject 120 mg into the skin every 28 (twenty-eight) days., Disp: 1 mL, Rfl: 11   rizatriptan  (MAXALT -MLT) 10 MG disintegrating tablet, Take 1 tablet (10 mg total) by mouth as needed for migraine. May repeat after 2 hours if needed.  Maximum 2 tablets in 24 hours., Disp: 9 tablet, Rfl: 11   tizanidine  (ZANAFLEX ) 2 MG capsule, Take 1 capsule (2 mg total) by mouth at bedtime., Disp: 90 capsule, Rfl: 3  No Known Allergies Review of Systems Objective:  There were no vitals filed for this visit.  General: Well developed, nourished, in no acute distress, alert and oriented x3   Dermatological: Skin is warm, dry and supple bilateral. Nails x 10 are well maintained; remaining integument appears unremarkable at this time. There are no open sores, no preulcerative lesions, no rash or signs of infection  present.  Vascular: Dorsalis Pedis artery and Posterior Tibial artery pedal pulses are 2/4 bilateral with immedate capillary fill time. Pedal hair growth present. No varicosities and no lower extremity edema present bilateral.   Neruologic: Grossly intact via light touch bilateral. Vibratory intact via tuning fork bilateral. Protective threshold with Semmes Wienstein monofilament intact to all pedal sites bilateral. Patellar and Achilles deep tendon reflexes 2+ bilateral. No Babinski or clonus noted bilateral.   Musculoskeletal: No gross boney pedal deformities bilateral. No pain, crepitus, or limitation noted with foot and ankle range of motion bilateral. Muscular strength 5/5 in all groups tested bilateral.  Pain and swelling to the dorsal lateral aspect of the left foot.  There is a distinct area of erythema at the base of the 4th and 3rd metatarsals.  There is pitting edema underlying this erythema.  Marked pain on palpation of these 2 metatarsals.  All of the tendons appear to be intact and normal.  Gait: Unassisted, Nonantalgic.    Radiographs:  Radiographs taken today demonstrate osseously mature individual no signs of infection no signs of major fractures.  Assessment & Plan:   Assessment: Cannot rule out stress fracture 4th and 3rd metatarsal bases left foot.  Plan: I will place her in a short cam boot and would like to follow-up with her in about 4 weeks at  which time another set of x-rays to be taken of that left foot.     Amunique Neyra T. [place], [place]

## 2023-10-06 ENCOUNTER — Encounter: Payer: Self-pay | Admitting: Podiatry

## 2023-10-08 DIAGNOSIS — Z0271 Encounter for disability determination: Secondary | ICD-10-CM

## 2023-10-08 NOTE — Telephone Encounter (Signed)
 Adv pt via MyChart her forms from [employer] were recd and I should be able to complete them this afternoon and fax back to them for her.

## 2023-10-08 NOTE — Telephone Encounter (Signed)
 Emailed completed forms to pt as well. See prev notes [email or web address]

## 2023-10-08 NOTE — Telephone Encounter (Signed)
 S/w pt to confirm continuous leave 10/06/23-11/11/23. She can not wear a boot at work. I will fax to [phone]

## 2023-10-09 NOTE — Progress Notes (Unsigned)
 NEUROLOGY FOLLOW UP OFFICE NOTE  Alexis Gomez [id number]  Assessment/Plan:   Migraine without aura, without status migrainosus, not intractable History of idiopathic intracranial hypertension - stable Cervicalgia   Migraine prevention:  Emgality  *** . Migraine rescue: rizatriptan  10mg  for severe headaches, Tylenol  for mild-moderate headaches *** Tizanidine  as needed for neck pain *** Limit use of pain relievers to no more than 9 days out of the month to prevent risk of rebound or medication-overuse headache. Keep headache diary Follow up ***   Subjective:  Alexis Gomez is a 38 year old female who follows up for headache   UPDATE: *** Intensity:  mild severe. Duration:  for mild headache:  2 hours with Tylenol ; for severe headaches, 1 to 2 hours with rizatriptan . *** Frequency:  usually 1 or 2 a month. ***    Current medications:  rizatriptan  10mg , Emgality , tizanidine  2mg  (neck pain)   HISTORY: In August 2020, she began experiencing double vision, described as vertical, occurs whether she is walking or sitting and watching TV.  Symptoms are intermittent.  They occur daily, lasting a minute or two off and on throughout the day.  No visual obscurations but sometimes floaters.  She notes hearing whooshing sounds in her ears.  About one week prior to onset of double vision, she had a headache, moderate to severe bifrontal/temporal associated with nausea and vomiting.  It subsequently resolved once the double vision started.  She denies history of headaches.   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 She was evaluated by ophthalmologist, Dr. Oneil Gomez, on 12/02/18, who noted bilateral myopia, but also exam demonstrated bilateral papilledema.  Visual fields were full.  Because she endorsed double vision,  myasthenia panel was checked on 12/16/2018, which was negative. 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 Lumbar puncture from 01/12/2019 demonstrated elevated opening pressure of 29.5 cm H2O.  CSF analysis was normal (including cell count, protein, glucose and culture).  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  She was started on acetazolamide  500mg  twice daily on 01/13/2019.  She had a repeat eye exam by Dr. Platts on 03/02/2019 which demonstrated resolution of papilledema.  Patient also endorsed resolution of diplopia.  Acetazolamide  was decreased to 250mg  twice daily.  Repeat eye exam with Dr. Platts again demonstrated no papilledema.  She had been doing well.  Acetazolamide  was discontinued in January 2022 but she had a recurrence of a daily headache in March 2022, described as moderate pressure and pounding headache from right posterior neck radiating to the right eye with photophobia, blurred vision and pulsatile tinnitus.  Acetazolamide  was restarted on 07/08/2020 but repeat eye exam with Dr. Platts on 07/11/2020 revealed no evidence of papilledema.  She was therefore switched from acetazolamide  to topiramate .   Past medications:  acetazolamide  Past triptan:  sumatriptan  Past antiepileptic:  topiramate  (cognitive changes)  PAST MEDICAL HISTORY: Past Medical History:  Diagnosis Date   Boil of buttock 04/28/2015   History of abnormal cervical Pap smear 05/19/2013   Has had laser cone in past   Intracranial hypertension    Pseudotumor cerebri    Vaginal Pap smear, abnormal     MEDICATIONS: Current Outpatient Medications on File Prior to Visit  Medication Sig Dispense  Refill   buPROPion  (WELLBUTRIN  XL) 150 MG 24 hr tablet Take 1 tablet (150 mg total) by mouth daily. 90 tablet 1   Drospirenone  (SLYND ) 4 MG TABS Take 1 tablet by mouth daily. 28 tablet 12    Galcanezumab -gnlm (EMGALITY ) 120 MG/ML SOAJ Inject 120 mg into the skin every 28 (twenty-eight) days. 1 mL 11   rizatriptan  (MAXALT -MLT) 10 MG disintegrating tablet Take 1 tablet (10 mg total) by mouth as needed for migraine. May repeat after 2 hours if needed.  Maximum 2 tablets in 24 hours. 9 tablet 11   tizanidine  (ZANAFLEX ) 2 MG capsule Take 1 capsule (2 mg total) by mouth at bedtime. 90 capsule 3   No current facility-administered medications on file prior to visit.    ALLERGIES: No Known Allergies  FAMILY HISTORY: Family History  Problem Relation Age of Onset   Seizures Father    Heart disease Maternal Grandmother    Diabetes Maternal Grandmother    Stroke Maternal Grandmother    Hypertension Maternal Grandmother    Heart disease Maternal Grandfather    Diabetes Maternal Grandfather    Stroke Maternal Grandfather    Heart disease Paternal Grandmother    Diabetes Paternal Grandmother    Heart disease Paternal Grandfather    Diabetes Paternal Grandfather       Objective:  *** Gomez: No acute distress.  Patient appears well-groomed.   Head:  Normocephalic/atraumatic Neck:  Supple.  No paraspinal tenderness.  Full range of motion. Heart:  Regular rate and rhythm. Neuro:  Alert and oriented.  Speech fluent and not dysarthric.  Language intact.  CN II-XII intact.  Bulk and tone normal.  Muscle strength 5/5 throughout.  Sensation to light touch intact.  Deep tendon reflexes 2+ throughout, toes downgoing.  Gait normal.  Romberg negative.    Alexis Dunnings, DO  CC: Alexis General, MD

## 2023-10-10 ENCOUNTER — Ambulatory Visit: Payer: 59 | Admitting: Neurology

## 2023-10-10 ENCOUNTER — Encounter: Payer: Self-pay | Admitting: Neurology

## 2023-10-10 ENCOUNTER — Other Ambulatory Visit (HOSPITAL_COMMUNITY): Payer: Self-pay

## 2023-10-10 ENCOUNTER — Ambulatory Visit: Admitting: Podiatry

## 2023-10-10 VITALS — BP 127/85 | HR 103 | Ht 62.0 in | Wt 154.0 lb

## 2023-10-10 DIAGNOSIS — M542 Cervicalgia: Secondary | ICD-10-CM

## 2023-10-10 DIAGNOSIS — Z8669 Personal history of other diseases of the nervous system and sense organs: Secondary | ICD-10-CM

## 2023-10-10 DIAGNOSIS — G43009 Migraine without aura, not intractable, without status migrainosus: Secondary | ICD-10-CM | POA: Diagnosis not present

## 2023-10-10 MED ORDER — TIZANIDINE HCL 2 MG PO CAPS
2.0000 mg | ORAL_CAPSULE | Freq: Every day | ORAL | 3 refills | Status: AC
  Filled 2023-10-10 – 2023-11-25 (×2): qty 90, 90d supply, fill #0
  Filled 2024-02-17: qty 90, 90d supply, fill #1
  Filled 2024-05-07: qty 90, 90d supply, fill #2

## 2023-10-10 NOTE — Patient Instructions (Signed)
 Refilled tizanidine .  You should still have refills for rizatriptan  and Emgality 

## 2023-10-29 ENCOUNTER — Other Ambulatory Visit (HOSPITAL_COMMUNITY): Payer: Self-pay

## 2023-10-31 ENCOUNTER — Encounter: Payer: Self-pay | Admitting: Podiatry

## 2023-10-31 ENCOUNTER — Ambulatory Visit (INDEPENDENT_AMBULATORY_CARE_PROVIDER_SITE_OTHER)

## 2023-10-31 ENCOUNTER — Ambulatory Visit: Admitting: Podiatry

## 2023-10-31 DIAGNOSIS — S92335D Nondisplaced fracture of third metatarsal bone, left foot, subsequent encounter for fracture with routine healing: Secondary | ICD-10-CM

## 2023-10-31 DIAGNOSIS — M19072 Primary osteoarthritis, left ankle and foot: Secondary | ICD-10-CM

## 2023-10-31 DIAGNOSIS — M722 Plantar fascial fibromatosis: Secondary | ICD-10-CM | POA: Diagnosis not present

## 2023-11-03 NOTE — Progress Notes (Signed)
 She presents today for follow-up of her possible fractures to the base of the 3rd and 4th metatarsal phalangeal joints.  States that even with the boot everything is still painful really has not improved at all.  She states that the bottom of the foot and outside of the foot is starting to hurt and develop a knot and bruising is present.  Objective: Vital signs are stable alert oriented x 3.  Pulses are palpable.  She has pain on palpation of her left foot particularly at the tarsometatarsal joints and frontal plane range of motion is exquisitely painful.  Radiographs reviewed again do not demonstrate any osseous abnormality in this area.  Though she does have swelling at the plantar fascia calcaneal insertion site which is exquisitely painful as well.  Assessment: Possible tear of the plantar fascia resulting in some lateral compensatory pain and swelling of the third fourth tarsometatarsal joint area.  Plan: Discussed etiology pathology and surgical therapies with radiographs being negative and pain levels not improving, may consider MRI at this point.  I will follow-up with her in the near future.

## 2023-11-04 ENCOUNTER — Telehealth: Payer: Self-pay | Admitting: Podiatry

## 2023-11-04 NOTE — Telephone Encounter (Signed)
 Spoke with patient is scheduled for 11/06/2023 MRI and has also a scheduled appointment in our office.

## 2023-11-04 NOTE — Telephone Encounter (Signed)
 Patient called in to let provider know she hasn't heard from the MRI department.

## 2023-11-05 ENCOUNTER — Ambulatory Visit: Admitting: Podiatry

## 2023-11-07 ENCOUNTER — Ambulatory Visit (HOSPITAL_COMMUNITY)
Admission: RE | Admit: 2023-11-07 | Discharge: 2023-11-07 | Disposition: A | Discharge status: Home / Self Care | Source: Ambulatory Visit | Attending: Podiatry | Admitting: Podiatry

## 2023-11-07 DIAGNOSIS — M722 Plantar fascial fibromatosis: Secondary | ICD-10-CM | POA: Diagnosis not present

## 2023-11-07 DIAGNOSIS — S92335D Nondisplaced fracture of third metatarsal bone, left foot, subsequent encounter for fracture with routine healing: Secondary | ICD-10-CM | POA: Insufficient documentation

## 2023-11-07 DIAGNOSIS — R600 Localized edema: Secondary | ICD-10-CM | POA: Diagnosis not present

## 2023-11-07 DIAGNOSIS — M25579 Pain in unspecified ankle and joints of unspecified foot: Secondary | ICD-10-CM | POA: Diagnosis not present

## 2023-11-18 ENCOUNTER — Encounter: Payer: Self-pay | Admitting: Podiatry

## 2023-11-18 ENCOUNTER — Ambulatory Visit: Payer: Self-pay | Admitting: Podiatry

## 2023-11-18 NOTE — Telephone Encounter (Signed)
 Please advise if patient needs to keep upcoming appointment in office with you.

## 2023-11-18 NOTE — Progress Notes (Signed)
 Spoke to patient informed of results stated has scheduled appointment and at which time will need medical release to return to work.

## 2023-11-19 NOTE — Telephone Encounter (Signed)
 Sent mess via MyChart to pt to see if RTW with restrictions/accommodations and what day/date?

## 2023-11-21 ENCOUNTER — Ambulatory Visit: Admitting: Podiatry

## 2023-11-21 DIAGNOSIS — Z0271 Encounter for disability determination: Secondary | ICD-10-CM

## 2023-11-21 NOTE — Telephone Encounter (Signed)
 Adv pt via MyChart her [employer] form was emailed to addr on file. See prev notes

## 2023-11-26 ENCOUNTER — Other Ambulatory Visit: Payer: Self-pay

## 2023-11-26 ENCOUNTER — Other Ambulatory Visit (HOSPITAL_COMMUNITY): Payer: Self-pay

## 2024-01-24 ENCOUNTER — Other Ambulatory Visit: Payer: Self-pay | Admitting: Medical Genetics

## 2024-01-24 DIAGNOSIS — Z006 Encounter for examination for normal comparison and control in clinical research program: Secondary | ICD-10-CM

## 2024-02-10 NOTE — Addendum Note (Signed)
 Addended by: PENNSTROM, JORDYN on: 02/10/2024 04:20 PM   Modules accepted: Orders

## 2024-02-14 ENCOUNTER — Other Ambulatory Visit: Payer: Self-pay

## 2024-02-24 ENCOUNTER — Other Ambulatory Visit (HOSPITAL_BASED_OUTPATIENT_CLINIC_OR_DEPARTMENT_OTHER): Payer: Self-pay

## 2024-02-24 DIAGNOSIS — G43909 Migraine, unspecified, not intractable, without status migrainosus: Secondary | ICD-10-CM | POA: Diagnosis not present

## 2024-02-24 DIAGNOSIS — G932 Benign intracranial hypertension: Secondary | ICD-10-CM | POA: Diagnosis not present

## 2024-02-24 MED ORDER — BUPROPION HCL ER (XL) 150 MG PO TB24
150.0000 mg | ORAL_TABLET | Freq: Every morning | ORAL | 1 refills | Status: AC
  Filled 2024-02-24: qty 90, 90d supply, fill #0

## 2024-03-13 ENCOUNTER — Other Ambulatory Visit: Payer: Self-pay | Admitting: Neurology

## 2024-03-13 ENCOUNTER — Other Ambulatory Visit (HOSPITAL_BASED_OUTPATIENT_CLINIC_OR_DEPARTMENT_OTHER): Payer: Self-pay

## 2024-03-16 ENCOUNTER — Other Ambulatory Visit (HOSPITAL_BASED_OUTPATIENT_CLINIC_OR_DEPARTMENT_OTHER): Payer: Self-pay

## 2024-03-16 DIAGNOSIS — H5213 Myopia, bilateral: Secondary | ICD-10-CM | POA: Diagnosis not present

## 2024-03-16 MED ORDER — EMGALITY 120 MG/ML ~~LOC~~ SOAJ
120.0000 mg | SUBCUTANEOUS | 5 refills | Status: AC
  Filled 2024-03-16: qty 1, 28d supply, fill #0
  Filled 2024-04-10: qty 1, 30d supply, fill #1
  Filled 2024-05-07 – 2024-05-12 (×2): qty 1, 30d supply, fill #2

## 2024-04-13 ENCOUNTER — Other Ambulatory Visit (HOSPITAL_BASED_OUTPATIENT_CLINIC_OR_DEPARTMENT_OTHER): Payer: Self-pay

## 2024-04-15 ENCOUNTER — Encounter: Payer: Self-pay | Admitting: Women's Health

## 2024-04-15 ENCOUNTER — Ambulatory Visit: Admitting: Women's Health

## 2024-04-15 ENCOUNTER — Other Ambulatory Visit (HOSPITAL_COMMUNITY)
Admission: RE | Admit: 2024-04-15 | Discharge: 2024-04-15 | Disposition: A | Discharge status: Home / Self Care | Source: Ambulatory Visit | Attending: Women's Health | Admitting: Women's Health

## 2024-04-15 VITALS — BP 121/84 | HR 93 | Ht 63.0 in | Wt 173.0 lb

## 2024-04-15 DIAGNOSIS — Z1331 Encounter for screening for depression: Secondary | ICD-10-CM

## 2024-04-15 DIAGNOSIS — Z113 Encounter for screening for infections with a predominantly sexual mode of transmission: Secondary | ICD-10-CM

## 2024-04-15 DIAGNOSIS — Z1151 Encounter for screening for human papillomavirus (HPV): Secondary | ICD-10-CM

## 2024-04-15 DIAGNOSIS — Z8742 Personal history of other diseases of the female genital tract: Secondary | ICD-10-CM

## 2024-04-15 DIAGNOSIS — Z01419 Encounter for gynecological examination (general) (routine) without abnormal findings: Secondary | ICD-10-CM | POA: Diagnosis present

## 2024-04-15 NOTE — Progress Notes (Signed)
 "  WELL-WOMAN EXAMINATION Patient name: Alexis Gomez MRN [id number]  Date of birth: Jan 09, 1986 Chief Complaint:   Annual Exam  History of Present Illness:   Alexis Gomez is a 39 y.o. G0P0000 Caucasian female being seen today for a routine well-woman exam.  Current complaints: none. Doing well on Slynd , no periods. Has stopped smoking, vaping some. Still has migraines w/ aura  PCP: [facility]      No LMP recorded. (Menstrual status: Oral contraceptives). The current method of family planning is oral progesterone-only contraceptive.  Last pap 01/14/20. Results were: NILM w/ HRHPV negative. H/O abnormal pap: yes 2011 w/ laser cone, normal paps since Last mammogram: never. Results were: N/A. Family h/o breast cancer: no Last colonoscopy: never. Results were: N/A. Family h/o colorectal cancer: no     04/15/2024    8:32 AM 01/31/2021    8:32 AM 03/08/2020    8:53 AM 01/14/2020   11:38 AM 08/19/2017    8:34 AM  Depression screen PHQ 2/9  Decreased Interest 0 0 0 0 0  Down, Depressed, Hopeless 0 0 0 0 0  PHQ - 2 Score 0 0 0 0 0  Altered sleeping 2 1  1    Tired, decreased energy 1 1  1    Change in appetite 0   1   Feeling bad or failure about yourself  0 0  0   Trouble concentrating 0 0  0   Moving slowly or fidgety/restless 0 0  0   Suicidal thoughts 0 0  0   PHQ-9 Score 3 2   3        Data saved with a previous flowsheet row definition        04/15/2024    8:33 AM 01/31/2021    8:33 AM 01/14/2020   11:39 AM  GAD 7 : Generalized Anxiety Score  Nervous, Anxious, on Edge 0 0 0  Control/stop worrying 0 0 0  Worry too much - different things 0 0 1  Trouble relaxing 0 0 0  Restless 0 0 0  Easily annoyed or irritable 0 0 0  Afraid - awful might happen 0 0 0  Total GAD 7 Score 0 0 1     Review of Systems:   Pertinent items are noted in HPI Denies any headaches, blurred vision, fatigue, shortness of breath, chest pain, abdominal pain, abnormal vaginal  discharge/itching/odor/irritation, problems with periods, bowel movements, urination, or intercourse unless otherwise stated above. Pertinent History Reviewed:  Reviewed past medical,surgical, social and family history.  Reviewed problem list, medications and allergies. Physical Assessment:   Vitals:   04/15/24 0836 04/15/24 0854  BP: 130/84 121/84  Pulse: 93   Weight: 173 lb (78.5 kg)   Height: 5' 3 (1.6 m)   Body mass index is 30.65 kg/m.        Physical Examination:   General appearance - well appearing, and in no distress  Mental status - alert, oriented to person, place, and time  Psych:  She has a normal mood and affect  Skin - warm and dry, normal color, no suspicious lesions noted  Chest - effort normal, all lung fields clear to auscultation bilaterally  Heart - normal rate and regular rhythm  Neck:  midline trachea, no thyromegaly or nodules  Breasts - breasts appear normal, no suspicious masses, no skin or nipple changes or  axillary nodes  Abdomen - soft, nontender, nondistended, no masses or organomegaly  Pelvic - VULVA: normal appearing vulva with no masses,  tenderness or lesions  VAGINA: normal appearing vagina with normal color and discharge, no lesions  CERVIX: normal appearing cervix without discharge or lesions, no CMT  Thin prep pap is done w/ HR HPV cotesting  UTERUS: uterus is felt to be normal size, shape, consistency and nontender   ADNEXA: No adnexal masses or tenderness noted.  Extremities:  No swelling or varicosities noted  Chaperone: Latisha Cresenzo  No results found for this or any previous visit (from the past 24 hours).  Assessment & Plan:  1) Well-Woman Exam  Labs/procedures today: pap  Mammogram: @ 37yo, or sooner if problems Colonoscopy: @ 37yo, or sooner if problems  No orders of the defined types were placed in this encounter.   Meds: No orders of the defined types were placed in this encounter.   Follow-up: Return in about 1 year  (around 04/15/2025) for Physical.  Suzen JONELLE Fetters CNM, WHNP-BC 04/15/2024 9:05 AM  "

## 2024-04-15 NOTE — Addendum Note (Signed)
 Addended by: ILEAN RUTHERFORD HERO on: 04/15/2024 09:20 AM   Modules accepted: Orders

## 2024-04-17 ENCOUNTER — Other Ambulatory Visit (HOSPITAL_BASED_OUTPATIENT_CLINIC_OR_DEPARTMENT_OTHER): Payer: Self-pay

## 2024-04-20 ENCOUNTER — Ambulatory Visit: Payer: Self-pay | Admitting: Women's Health

## 2024-04-20 LAB — CYTOLOGY - PAP
Chlamydia: NEGATIVE
Comment: NEGATIVE
Comment: NEGATIVE
Comment: NORMAL
Diagnosis: NEGATIVE
High risk HPV: NEGATIVE
Neisseria Gonorrhea: NEGATIVE

## 2024-05-08 ENCOUNTER — Other Ambulatory Visit: Payer: Self-pay

## 2024-05-12 ENCOUNTER — Other Ambulatory Visit (HOSPITAL_BASED_OUTPATIENT_CLINIC_OR_DEPARTMENT_OTHER): Payer: Self-pay

## 2024-05-12 ENCOUNTER — Telehealth (HOSPITAL_BASED_OUTPATIENT_CLINIC_OR_DEPARTMENT_OTHER): Payer: Self-pay

## 2024-05-12 NOTE — Telephone Encounter (Signed)
 Where is this request coming from? [facility] Medication: Emgality  120mg  Prior authorization required? Yes If YES, on primary or secondary insurance? Primary Comments:

## 2024-05-13 ENCOUNTER — Other Ambulatory Visit (HOSPITAL_BASED_OUTPATIENT_CLINIC_OR_DEPARTMENT_OTHER): Payer: Self-pay

## 2024-05-14 ENCOUNTER — Other Ambulatory Visit (HOSPITAL_BASED_OUTPATIENT_CLINIC_OR_DEPARTMENT_OTHER): Payer: Self-pay

## 2024-05-14 ENCOUNTER — Other Ambulatory Visit (HOSPITAL_COMMUNITY): Payer: Self-pay

## 2024-05-14 ENCOUNTER — Telehealth: Payer: Self-pay | Admitting: Pharmacy Technician

## 2024-05-14 NOTE — Telephone Encounter (Signed)
 PA request has been Approved. New Encounter has been or will be created for follow up. For additional info see Pharmacy Prior Auth telephone encounter from 05/14/2024.

## 2024-05-14 NOTE — Telephone Encounter (Signed)
 Pharmacy Patient Advocate Encounter  Received notification from [facility] that Prior Authorization for Emgality  120MG /ML auto-injectors (migraine)  has been APPROVED from 05/13/2024 to 05/13/2025   PA #/Case ID/Reference #: [id number] KEY: [id number]

## 2024-10-12 ENCOUNTER — Ambulatory Visit: Admitting: Neurology
